# Patient Record
Sex: Female | Born: 1980 | Race: White | Hispanic: No | Marital: Married | Smoking: Current every day smoker
Health system: Southern US, Community
[De-identification: ages and names within clinical notes are randomized; demographics above are authoritative.]

## PROBLEM LIST (undated history)

## (undated) DIAGNOSIS — L659 Nonscarring hair loss, unspecified: Secondary | ICD-10-CM

## (undated) DIAGNOSIS — F319 Bipolar disorder, unspecified: Secondary | ICD-10-CM

## (undated) DIAGNOSIS — N926 Irregular menstruation, unspecified: Secondary | ICD-10-CM

## (undated) DIAGNOSIS — E669 Obesity, unspecified: Secondary | ICD-10-CM

## (undated) DIAGNOSIS — F329 Major depressive disorder, single episode, unspecified: Secondary | ICD-10-CM

## (undated) DIAGNOSIS — F32A Depression, unspecified: Secondary | ICD-10-CM

## (undated) DIAGNOSIS — Z87442 Personal history of urinary calculi: Secondary | ICD-10-CM

## (undated) DIAGNOSIS — K219 Gastro-esophageal reflux disease without esophagitis: Secondary | ICD-10-CM

## (undated) DIAGNOSIS — R002 Palpitations: Secondary | ICD-10-CM

## (undated) DIAGNOSIS — G43909 Migraine, unspecified, not intractable, without status migrainosus: Secondary | ICD-10-CM

## (undated) HISTORY — DX: Palpitations: R00.2

## (undated) HISTORY — DX: Bipolar disorder, unspecified: F31.9

## (undated) HISTORY — DX: Obesity, unspecified: E66.9

## (undated) HISTORY — DX: Migraine, unspecified, not intractable, without status migrainosus: G43.909

## (undated) HISTORY — DX: Irregular menstruation, unspecified: N92.6

## (undated) HISTORY — DX: Nonscarring hair loss, unspecified: L65.9

## (undated) HISTORY — DX: Major depressive disorder, single episode, unspecified: F32.9

## (undated) HISTORY — DX: Depression, unspecified: F32.A

## (undated) HISTORY — PX: TUBAL LIGATION: SHX77

## (undated) HISTORY — DX: Gastro-esophageal reflux disease without esophagitis: K21.9

---

## 2004-12-16 ENCOUNTER — Ambulatory Visit: Payer: Self-pay | Admitting: Family Medicine

## 2005-07-27 ENCOUNTER — Observation Stay: Payer: Self-pay

## 2005-07-28 ENCOUNTER — Ambulatory Visit: Payer: Self-pay | Admitting: Obstetrics & Gynecology

## 2005-09-08 ENCOUNTER — Inpatient Hospital Stay: Payer: Self-pay

## 2005-09-19 IMAGING — US US PELV - US TRANSVAGINAL
2 series · 13 of 25 positions shown · non-contrast
Comparison: none

REASON FOR EXAM: Excessive menses, severe cramping
COMMENTS:

[Series 1: us pelv - us transvaginal · 0.13mm/px · 8 of 36 slices shown (1 of 2)]
[im 1/36]
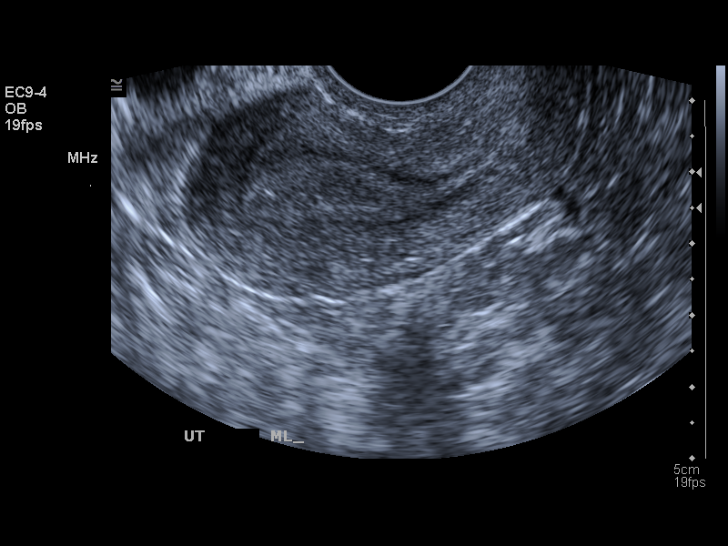
[im 6/36]
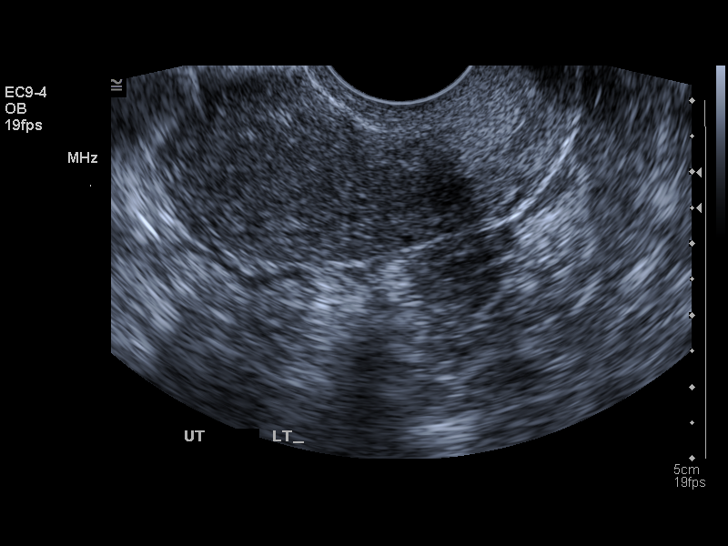
[im 11/36]
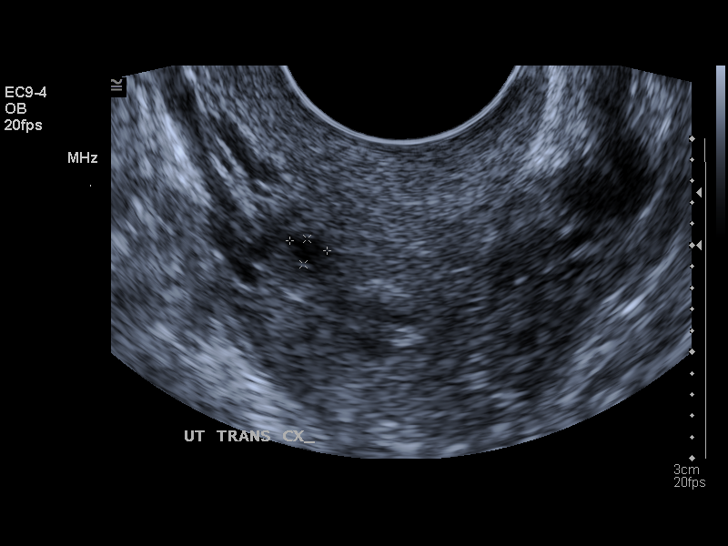
[im 16/36]
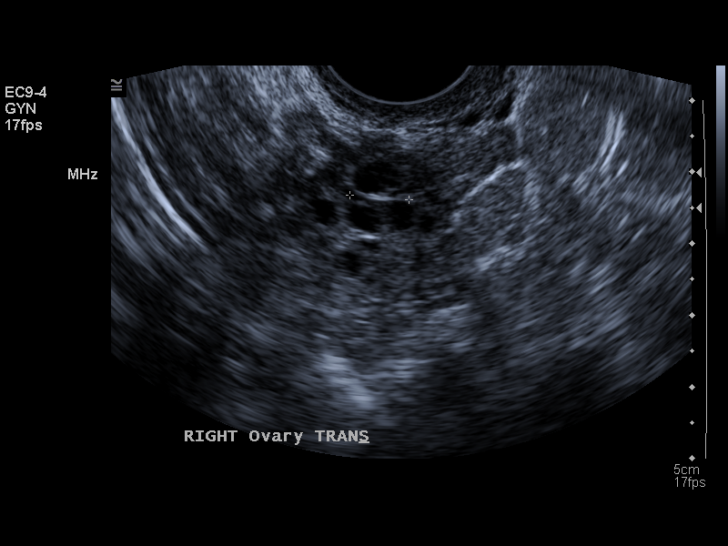
[im 21/36]
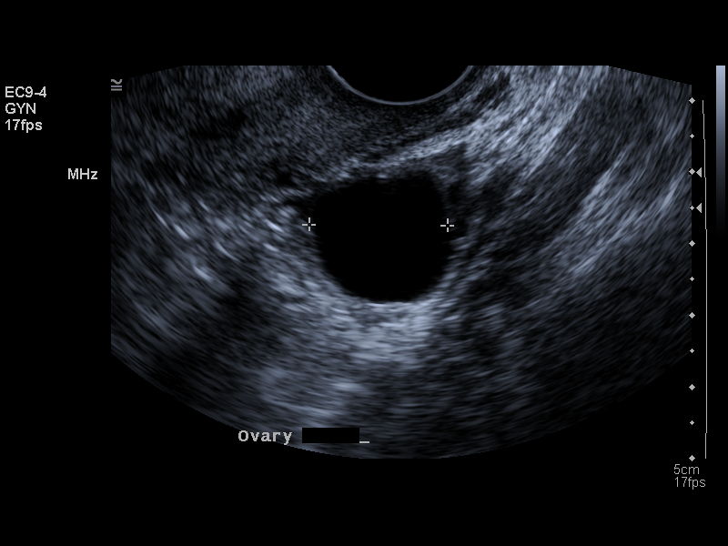
[im 26/36]
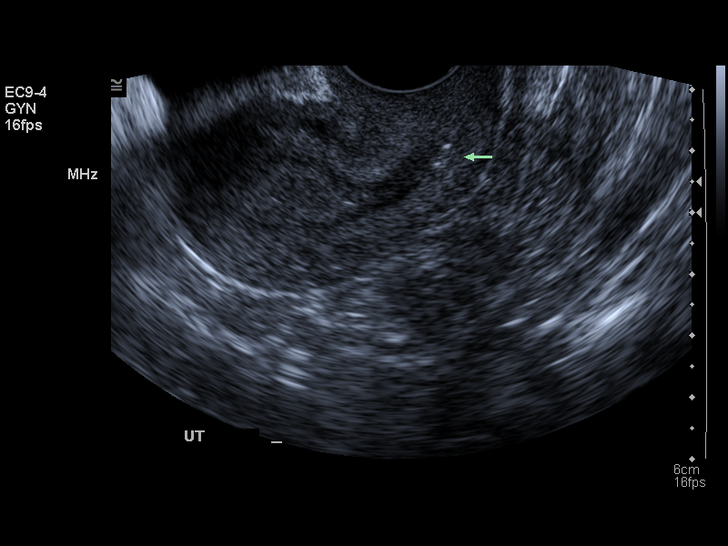
[im 31/36]
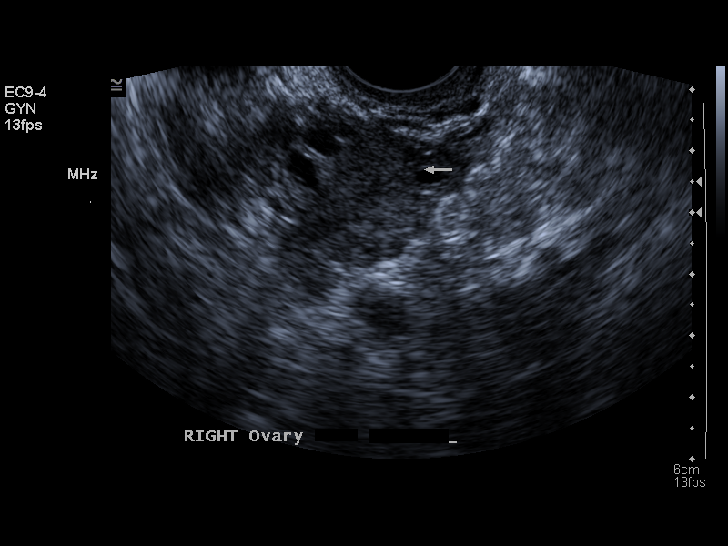
[im 36/36]
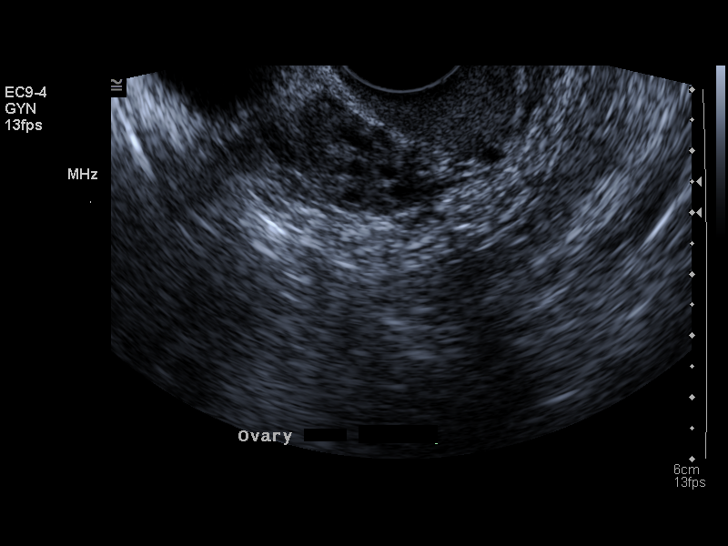

[Series 1: us pelv - us transvaginal · 0.33mm/px · 5 of 23 slices shown (2 of 2)]
[im 3/23]
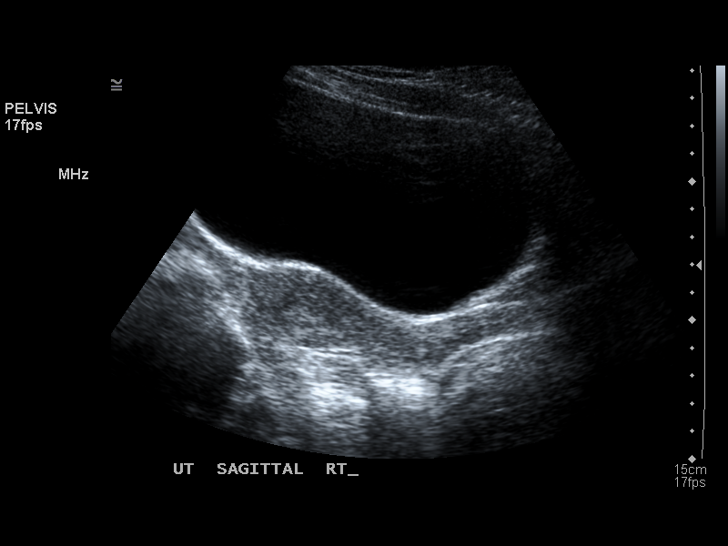
[im 8/23]
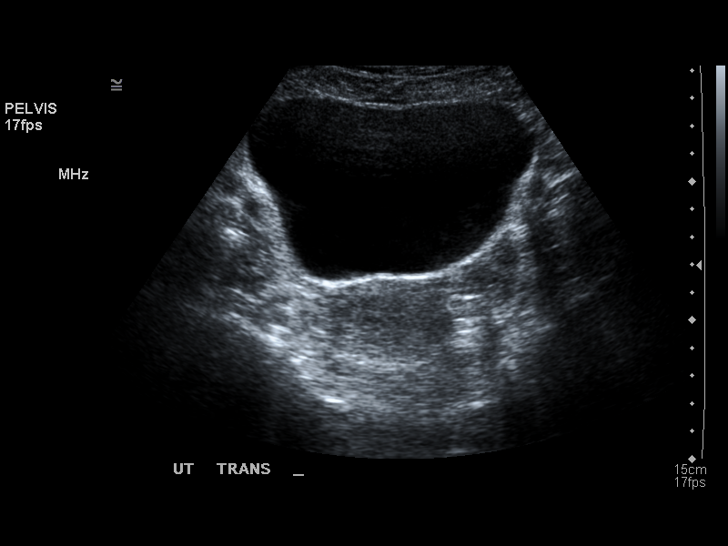
[im 13/23]
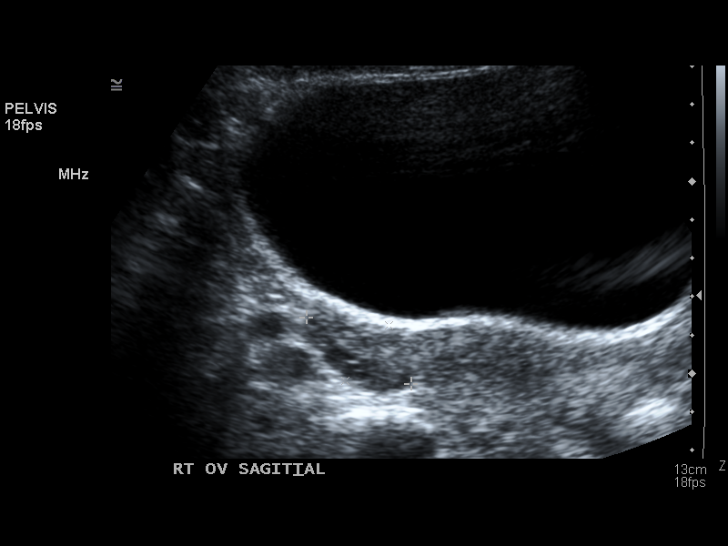
[im 18/23]
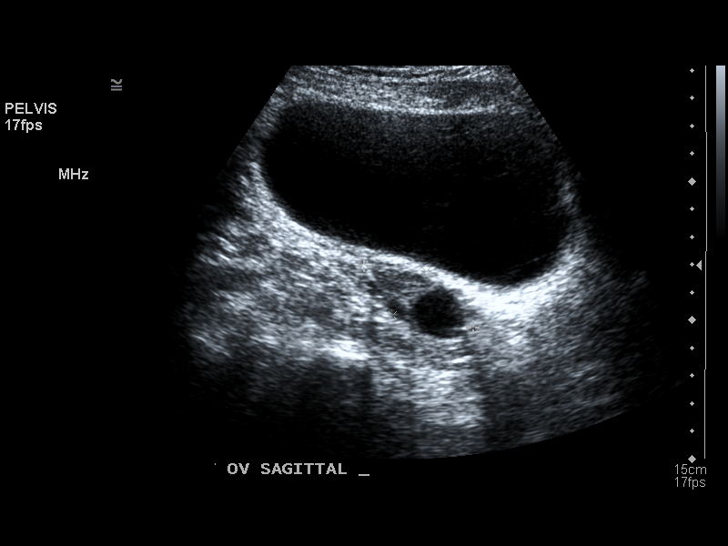
[im 23/23]
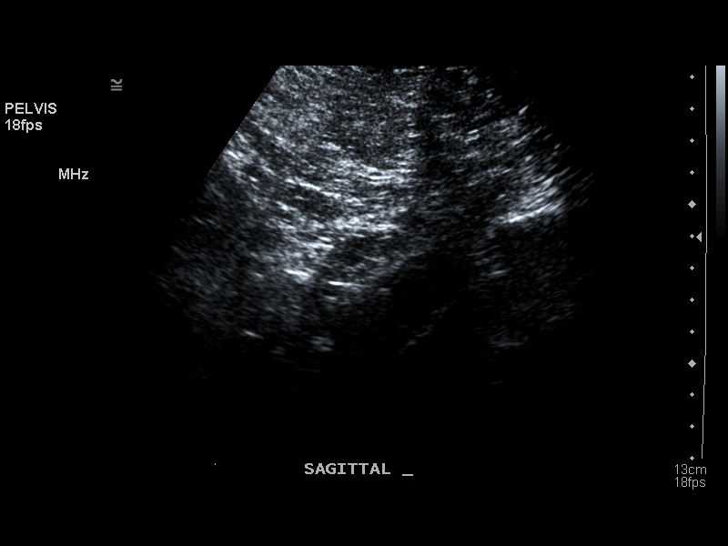

[13 of 25 positions shown; findings below may reference images not displayed]

PROCEDURE:     US  - US PELVIS MASS EXAM  - [DATE]  [DATE] [DATE]  [DATE]

RESULT:     The patient has menorrhagia and severe cramping.  The patient
reports the majority of her symptoms are on the RIGHT.

There is a simple-appearing cystic structure involving the LEFT ovary
measuring approximately 3.6 x 2.1 x 1.9 cm.  There are other smaller cystic
structures associated with the LEFT ovary.  The overall maximal dimension of
the LEFT ovary including both solid and cystic components is approximately
2.1 cm.  The RIGHT ovary exhibits multiple tiny cystic structures or
developing follicles.  The overall maximal dimension of the RIGHT ovary is
approximately 3.1 cm.  The uterus exhibits normal echo texture but the
endometrial stripe does appear thickened.  The uterus measures 7.5 x 3.4 x
4.8 cm and the endometrial stripe is 8.7 mm.  No free fluid is seen in the
cul-de-sac.
IMPRESSION: There is a dominant simple-appearing structure in the LEFT adnexal region
which measures approximately 2.1 cm in greatest dimension.

The RIGHT ovary exhibits developing follicles but no abnormal masses.

The endometrial stripe is thickened at approximately 8.7 mm.  The uterus is
otherwise normal in appearance.

## 2009-02-27 ENCOUNTER — Other Ambulatory Visit: Payer: Self-pay | Admitting: Specialist

## 2010-11-13 ENCOUNTER — Inpatient Hospital Stay: Payer: Self-pay

## 2011-12-02 LAB — HM PAP SMEAR: HM Pap smear: NORMAL

## 2011-12-02 LAB — LIPID PANEL
CHOLESTEROL: 187 mg/dL (ref 0–200)
HDL: 44 mg/dL (ref 35–70)
LDL CALC: 127 mg/dL
Triglycerides: 79 mg/dL (ref 40–160)

## 2013-12-20 ENCOUNTER — Ambulatory Visit: Payer: Self-pay | Admitting: Obstetrics & Gynecology

## 2013-12-20 LAB — CBC
HCT: 39.3 % (ref 35.0–47.0)
HGB: 13.7 g/dL (ref 12.0–16.0)
MCH: 31.2 pg (ref 26.0–34.0)
MCHC: 34.9 g/dL (ref 32.0–36.0)
MCV: 89 fL (ref 80–100)
PLATELETS: 204 10*3/uL (ref 150–440)
RBC: 4.4 10*6/uL (ref 3.80–5.20)
RDW: 13.2 % (ref 11.5–14.5)
WBC: 6.1 10*3/uL (ref 3.6–11.0)

## 2013-12-23 LAB — HM PAP SMEAR: HM PAP: NORMAL

## 2013-12-27 ENCOUNTER — Ambulatory Visit: Payer: Self-pay | Admitting: Obstetrics & Gynecology

## 2013-12-27 HISTORY — PX: DILATION AND CURETTAGE OF UTERUS: SHX78

## 2013-12-29 LAB — PATHOLOGY REPORT

## 2014-01-25 ENCOUNTER — Emergency Department: Payer: Self-pay | Admitting: Emergency Medicine

## 2014-01-25 LAB — CBC
HCT: 38 % (ref 35.0–47.0)
HGB: 12.8 g/dL (ref 12.0–16.0)
MCH: 30.2 pg (ref 26.0–34.0)
MCHC: 33.8 g/dL (ref 32.0–36.0)
MCV: 89 fL (ref 80–100)
Platelet: 232 10*3/uL (ref 150–440)
RBC: 4.26 10*6/uL (ref 3.80–5.20)
RDW: 13 % (ref 11.5–14.5)
WBC: 5.1 10*3/uL (ref 3.6–11.0)

## 2015-02-01 NOTE — Op Note (Signed)
PATIENT NAME:  Kara [name], Kara [name] MR#:  [id number] DATE OF BIRTH:  02/08/1981  DATE OF PROCEDURE:  12/27/2013  PREOPERATIVE DIAGNOSIS: Abnormal uterine bleeding and endometrial abnormality.   POSTOPERATIVE DIAGNOSIS: Abnormal uterine bleeding and endometrial abnormality.   PROCEDURE: Hysteroscopy dilatation and curettage procedure .  SURGEON: Dierdre Searles. Paul Harris, MD  ANESTHESIA: General.   BLOOD LOSS: Minimal.  COMPLICATIONS: None.  FINDINGS: Proliferative endometrium with possible polypoid tissue.   DISPOSITION: To recovery room stable.  TECHNIQUE: The patient was prepped and draped in usual sterile fashion after adequate anesthesia is obtained in a dorsal lithotomy position. The bladder was drained with a Robinson catheter. The anterior lip of the cervix was grasped with a sharp toothed tenaculum and the cervix is easily dilatable, as well as sounded to 6 cm. A 30 degree hysteroscope with lactated ringer distention of the intrauterine cavity is performed with the above mentioned findings visualized. Using the MyoSure device, careful resection of all proliferative tissue and polypoid tissue is performed with an excellent contour and lining visualized at the conclusion of this part of the procedure. There is minimal discrepancy of fluid  during the portion of the case. The hysteroscope is removed and the tenaculum is also removed from the cervix with excellent hemostasis noted. The patient goes to recovery room in stable condition. Endocervical curettage was performed as well and this plus the endometrial curettage was sent to pathology for further review.    ____________________________ R. Annamarie MajorPaul Harris, MD rph:sg D: 12/27/2013 12:00:00 ET T: 12/27/2013 12:15:07 ET JOB#: [id number]  cc: Dierdre Searles. Paul Harris, MD, <Dictator> Nadara MustardOBERT P HARRIS MD ELECTRONICALLY SIGNED 12/28/2013 2:46

## 2015-04-08 ENCOUNTER — Telehealth: Payer: Self-pay | Admitting: Family Medicine

## 2015-04-08 NOTE — Telephone Encounter (Signed)
PT IS ASKING TO GET APPT TO GET BACK ON HER BIPOLAR MEDICATION AND I DO NOT SEE ANYTHING FOR SEVERAL WEEKS. CAN YOU ADVISE ME OF A TIME THAT WE COULD MAYBE FIT HER IN THE SCHEDULE.  PT PHONE NUMBER IS [phone].THANKS

## 2015-04-09 NOTE — Telephone Encounter (Signed)
It can be the last appointment of the day as long as we have a nurse to stay late with me

## 2015-04-09 NOTE — Telephone Encounter (Signed)
Pt was called to see if she could come in 04/10/15 or 04/11/15 and she stated that needed an appt @ 0330 or later because her job won't let her off. Please advise as to where to sched her.

## 2015-04-10 ENCOUNTER — Encounter: Payer: Self-pay | Admitting: Family Medicine

## 2015-04-10 ENCOUNTER — Ambulatory Visit (INDEPENDENT_AMBULATORY_CARE_PROVIDER_SITE_OTHER): Payer: Self-pay | Admitting: Family Medicine

## 2015-04-10 VITALS — BP 128/68 | HR 103 | Temp 98.0°F | Resp 18 | Ht 64.0 in | Wt 175.0 lb

## 2015-04-10 DIAGNOSIS — E669 Obesity, unspecified: Secondary | ICD-10-CM | POA: Insufficient documentation

## 2015-04-10 DIAGNOSIS — F319 Bipolar disorder, unspecified: Secondary | ICD-10-CM | POA: Insufficient documentation

## 2015-04-10 DIAGNOSIS — G43009 Migraine without aura, not intractable, without status migrainosus: Secondary | ICD-10-CM | POA: Insufficient documentation

## 2015-04-10 DIAGNOSIS — F3131 Bipolar disorder, current episode depressed, mild: Secondary | ICD-10-CM

## 2015-04-10 DIAGNOSIS — K219 Gastro-esophageal reflux disease without esophagitis: Secondary | ICD-10-CM

## 2015-04-10 DIAGNOSIS — G43019 Migraine without aura, intractable, without status migrainosus: Secondary | ICD-10-CM

## 2015-04-10 DIAGNOSIS — B001 Herpesviral vesicular dermatitis: Secondary | ICD-10-CM | POA: Insufficient documentation

## 2015-04-10 DIAGNOSIS — E66811 Obesity, class 1: Secondary | ICD-10-CM | POA: Insufficient documentation

## 2015-04-10 MED ORDER — DIVALPROEX SODIUM 250 MG PO DR TAB: 250.0000 mg | DELAYED_RELEASE_TABLET | Freq: Every evening | ORAL | Status: DC

## 2015-04-10 MED ORDER — CITALOPRAM HYDROBROMIDE 20 MG PO TABS: 20.0000 mg | ORAL_TABLET | Freq: Every day | ORAL | Status: DC

## 2015-04-10 MED ORDER — SUMATRIPTAN SUCCINATE 100 MG PO TABS: 100.0000 mg | ORAL_TABLET | ORAL | Status: DC | PRN

## 2015-04-10 NOTE — Telephone Encounter (Signed)
Pt coming in today at 4 per your request

## 2015-04-10 NOTE — Progress Notes (Signed)
Name: Kara [name]   MRN: [id number]    DOB: 1981/05/27   Date:04/10/2015       Progress Note  Subjective  Chief Complaint  Chief Complaint  Patient presents with  . Medication Refill    1 year F/U  . Anxiety    Panic Attacks worsten with no medications-couple of times weekly-stress working 2 jobs  . Manic Behavior    Patient states she stays angry without taking her medications    HPI  Migraine headaches: she does not have aura, pain is behind her eyes, described as sharp, may be associated with nausea but no vomiting, also has phonophobia and photophobia. Episodes have been more frequent now, almost daily headaches, taking ibuprofen to control symptoms.   Bipolar Disorder: she has been out of all her medication since Fall on 2015 when she lost her job. She states initially she was feeling well, but over past six months symptoms have been progressively worse, but much worse over the past month and she decided to come in to resume medication.  No recent episodes of mania, but is feeling very depression. No energy to get up in the morning. Able to perform ADL, even though she does not want to. She has anhedonia. Having difficulty communicating with the father of her children. She lives with her parents .   GERD: off PPI since the Fall, episodes are seldom and is taking Tums a couple times month.  Patient Active Problem List   Diagnosis Date Noted  . Affective bipolar disorder 04/10/2015  . Gastro-esophageal reflux disease without esophagitis 04/10/2015  . Migraine without aura and responsive to treatment 04/10/2015  . Obesity (BMI 30-39.9) 04/10/2015  . Cold sore 04/10/2015    Past Surgical History  Procedure Laterality Date  . Dilation and curettage of uterus  12/27/2013  . Tubal ligation      Family History  Problem Relation Age of Onset  . Metabolic syndrome Mother   . COPD Father     History   Social History  . Marital Status: Married    Spouse Name: N/A  .  Number of Children: N/A  . Years of Education: N/A   Occupational History  . Not on file.   Social History Main Topics  . Smoking status: Former Smoker -- 0.50 packs/day for 2 years    Types: Cigarettes    Start date: 01/10/2003    Quit date: 01/09/2005  . Smokeless tobacco: Never Used  . Alcohol Use: 0.0 oz/week    0 Standard drinks or equivalent per week     Comment: occasionally  . Drug Use: No  . Sexual Activity: No   Other Topics Concern  . Not on file   Social History Narrative     Current outpatient prescriptions:  .  citalopram (CELEXA) 20 MG tablet, Take 1 tablet (20 mg total) by mouth daily., Disp: 30 tablet, Rfl: 2 .  divalproex (DEPAKOTE) 250 MG DR tablet, Take 1 tablet (250 mg total) by mouth every evening., Disp: 30 tablet, Rfl: 2 .  SUMAtriptan (IMITREX) 100 MG tablet, Take 1 tablet (100 mg total) by mouth as needed (may take a second pill in 2 hours, no more than 2 in 24 hours)., Disp: 10 tablet, Rfl: 2  No Known Allergies   ROS  Constitutional: Negative for fever, mild weight loss.  Respiratory: Negative for cough and shortness of breath.   Cardiovascular: Negative for chest pain or palpitations.  Gastrointestinal: Negative for abdominal pain, no bowel changes.  Musculoskeletal: Negative for gait problem or joint swelling.  Skin: Negative for rash.  Neurological: Negative for dizziness but positive for headache.  No other specific complaints in a complete review of systems (except as listed in HPI above).   Objective  Filed Vitals:   04/10/15 1602  BP: 128/68  Pulse: 103  Temp: 98 F (36.7 C)  TempSrc: Oral  Resp: 18  Height:  (1.626 m)  Weight: 175 lb (79.379 kg)  SpO2: 98%    Body mass index is 30.02 kg/(m^2).  Physical Exam  Constitutional: Patient appears well-developed and well-nourished. No distress.  Eyes:  No scleral icterus.  PERL Neck: Normal range of motion. Neck supple. Cardiovascular: Normal rate, regular rhythm and  normal heart sounds.  No murmur heard. No BLE edema. Pulmonary/Chest: Effort normal and breath sounds normal. No respiratory distress. Abdominal: Soft.  There is no tenderness. Psychiatric: Patient has a normal mood and affect. behavior is normal. Judgment and thought content normal.  PHQ2/9: Depression screen PHQ 2/9 04/10/2015  Decreased Interest 3  Down, Depressed, Hopeless 3  PHQ - 2 Score 6  Altered sleeping 3  Tired, decreased energy 3  Change in appetite 3  Feeling bad or failure about yourself  3  Trouble concentrating 0  Moving slowly or fidgety/restless 0  Suicidal thoughts 0  PHQ-9 Score 18  Difficult doing work/chores Not difficult at all   She wants to resume medication  Fall Risk: Fall Risk  04/10/2015  Falls in the past year? No     Assessment & Plan  1. Bipolar disorder, current episode depressed, severe, without psychotic features She is willing to resume medication  - divalproex (DEPAKOTE) 250 MG DR tablet; Take 1 tablet (250 mg total) by mouth every evening.  Dispense: 30 tablet; Refill: 2 - citalopram (CELEXA) 20 MG tablet; Take 1 tablet (20 mg total) by mouth daily.  Dispense: 30 tablet; Refill: 2  2. Migraine without aura, intractable Resume medications, avoid nsaid's on a regular basis, try drinking more water, sleeping more and resume Depakote - SUMAtriptan (IMITREX) 100 MG tablet; Take 1 tablet (100 mg total) by mouth as needed (may take a second pill in 2 hours, no more than 2 in 24 hours).  Dispense: 10 tablet; Refill: 2 - divalproex (DEPAKOTE) 250 MG DR tablet; Take 1 tablet (250 mg total) by mouth every evening.  Dispense: 30 tablet; Refill: 2  3. Gastro-esophageal reflux disease without esophagitis Off medication, symptoms are under control

## 2015-04-10 NOTE — Patient Instructions (Signed)
  GET HELP  Contact a suicide hotline, crisis center, or local suicide prevention center for help right away. Local centers may include a hospital, clinic, community service organization, social service provider, or health department.  Call your local emergency services (911 in the [place]).  Call a suicide hotline:  1-800-273-TALK (956-672-81551-(323)010-5951) in the [place].  1-800-SUICIDE 281-682-7871(1-450-558-3310) in the [place].  [phone] in the [place] for Spanish-speaking counselors.  4-696-295-2WUX1-800-799-4TTY 863-465-7821(1-443-449-0257) in the [place] for TTY users.  Visit the following websites for information and help:  National Suicide Prevention Lifeline: www.suicidepreventionlifeline.org  Hopeline: www.hopeline.com  [employer] for Suicide Prevention: https://www.ayers.com/www.afsp.org  For lesbian, gay, bisexual, transgender, or questioning youth, contact [employer]:  3-664-4-I-HKVQQV1-866-4-U-TREVOR 608-058-3918(1-(986)075-9117) in the [place].  www.thetrevorproject.org  In [place], treatment resources are listed in each province with listings available under [employer] or similar titles. Another source for Crisis Centres by [place] is located at http://www.suicideprevention.ca/in-crisis-now/find-a-crisis-centre-now/crisis-centres Document Released: 04/03/2003 Document Revised: 12/20/2011 Document Reviewed: 06/04/2008 [facility] Patient Information 2015 [place]. This information is not intended to replace advice given to you by your health care provider. Make sure you discuss any questions you have with your health care provider.

## 2016-08-30 ENCOUNTER — Encounter: Payer: Self-pay | Admitting: Physician Assistant

## 2016-08-30 ENCOUNTER — Ambulatory Visit (INDEPENDENT_AMBULATORY_CARE_PROVIDER_SITE_OTHER): Payer: Self-pay | Admitting: Physician Assistant

## 2016-08-30 VITALS — BP 118/78 | HR 93 | Temp 98.2°F | Resp 14 | Ht 64.0 in | Wt 186.0 lb

## 2016-08-30 DIAGNOSIS — F3130 Bipolar disorder, current episode depressed, mild or moderate severity, unspecified: Secondary | ICD-10-CM

## 2016-08-30 DIAGNOSIS — F319 Bipolar disorder, unspecified: Secondary | ICD-10-CM | POA: Insufficient documentation

## 2016-08-30 MED ORDER — QUETIAPINE FUMARATE ER 50 MG PO TB24: ORAL_TABLET | ORAL | 0 refills | Status: DC

## 2016-08-30 NOTE — Progress Notes (Signed)
Patient presents to clinic today to establish care.  Patient with long-standing history of bipolar disorder, previously treated with a combination of Depakote and Citalopram. Patient has been out of medications for over a year due to cost and losing her insurance. Has noted worsening depression with anhedonia, irritability and depressed mood. Denies panic attack. Denies manic attacks or other symptoms of mania. Denies SI/HI. Is seeing a counselor currently. Cannot get in for medication management with psychiatry until the first of the year.  Health Maintenance: Immunizations -- Declines flu shot. PAP --  Endorses up-to-date. Denies history of abnormal PAP smear.  Past Medical History:  Diagnosis Date  . Bipolar disorder (HCC)   . Depression   . GERD (gastroesophageal reflux disease)   . Hair loss   . Irregular menstrual cycle   . Migraines   . Obesity   . Palpitation     Past Surgical History:  Procedure Laterality Date  . DILATION AND CURETTAGE OF UTERUS  12/27/2013  . TUBAL LIGATION      No current outpatient prescriptions on file prior to visit.   No current facility-administered medications on file prior to visit.     No Known Allergies  Family History  Problem Relation Age of Onset  . Metabolic syndrome Mother   . Cancer Mother     Ovarian and Cervical  . COPD Father     Social History   Social History  . Marital status: Married    Spouse name: N/A  . Number of children: N/A  . Years of education: N/A   Occupational History  . Not on file.   Social History Main Topics  . Smoking status: Current Every Day Smoker    Packs/day: 1.00    Years: 2.00    Types: Cigarettes  . Smokeless tobacco: Never Used  . Alcohol use 0.0 oz/week     Comment: rare  . Drug use: No  . Sexual activity: Yes    Partners: Female   Other Topics Concern  . Not on file   Social History Narrative  . No narrative on file   Review of Systems  Constitutional: Negative  for chills and fever.  Respiratory: Negative for cough and shortness of breath.   Cardiovascular: Negative for chest pain.  Neurological: Negative for dizziness and loss of consciousness.  Psychiatric/Behavioral: Positive for depression. Negative for hallucinations, memory loss, substance abuse and suicidal ideas. The patient is not nervous/anxious and does not have insomnia.    BP 118/78   Pulse 93   Temp 98.2 F (36.8 C) (Oral)   Resp 14   Ht 5\' 4"  (1.626 m)   Wt 186 lb (84.4 kg)   SpO2 98%   BMI 31.93 kg/m   Physical Exam  Constitutional: She is oriented to person, place, and time and well-developed, well-nourished, and in no distress.  HENT:  Head: Normocephalic and atraumatic.  Right Ear: External ear normal.  Left Ear: External ear normal.  Nose: Nose normal.  Mouth/Throat: Oropharynx is clear and moist. No oropharyngeal exudate.  TM within normal limits bilaterally.  Eyes: Conjunctivae are normal.  Neck: Neck supple.  Cardiovascular: Normal rate, regular rhythm, normal heart sounds and intact distal pulses.   Pulmonary/Chest: Effort normal and breath sounds normal. No respiratory distress. She has no wheezes. She has no rales. She exhibits no tenderness.  Abdominal: Soft. Bowel sounds are normal.  Neurological: She is alert and oriented to person, place, and time.  Skin: Skin is warm and  dry. No rash noted.  Psychiatric: Affect normal.  Vitals reviewed.  Assessment/Plan: Bipolar depression (HCC) No manic episodes. No alarm signs/symptoms. Discussed treatment options. Discussed potential costs. Decided to attempt trial of Seroquel XR. Patient to fill out assistance paperwork to help with cost until she has insurance. Will start with 50 mg QHS, increase to 100 mg after 2 days. She is to call as discussed to let us know how she is doing for further titration of medication. Will attempt to titrate to 300 mg per Up-to-date guidelines. FU scheduled for 2 weeks. ER precautions  reviewed with patient and family member present for visit.    Piedad ClimesMartin, Ellard Nan Cody, PA-C

## 2016-08-30 NOTE — Assessment & Plan Note (Signed)
No manic episodes. No alarm signs/symptoms. Discussed treatment options. Discussed potential costs. Decided to attempt trial of Seroquel XR. Patient to fill out assistance paperwork to help with cost until she has insurance. Will start with 50 mg QHS, increase to 100 mg after 2 days. She is to call as discussed to let us know how she is doing for further titration of medication. Will attempt to titrate to 300 mg per Up-to-date guidelines. FU scheduled for 2 weeks. ER precautions reviewed with patient and family member present for visit.

## 2016-08-30 NOTE — Patient Instructions (Signed)
Please start the Seroquel each evening as directed. Will will start low and work up to a higher, more therapeutic dose of medication.  Call me in 1 week to let me know how you are tolerating medication.  If you note any side effects, please call the office. If you note any worsening symptoms or thoughts of harming yourself or others, please stop medication and call 911 or [facility].  Follow-up in 2-3 weeks.

## 2016-08-30 NOTE — Progress Notes (Signed)
Pre visit review using our clinic review tool, if applicable. No additional management support is needed unless otherwise documented below in the visit note. 

## 2016-09-20 ENCOUNTER — Ambulatory Visit (INDEPENDENT_AMBULATORY_CARE_PROVIDER_SITE_OTHER): Payer: Self-pay | Admitting: Physician Assistant

## 2016-09-20 ENCOUNTER — Encounter: Payer: Self-pay | Admitting: Physician Assistant

## 2016-09-20 VITALS — BP 102/70 | HR 111 | Temp 98.0°F | Resp 14 | Ht 64.0 in | Wt 189.0 lb

## 2016-09-20 DIAGNOSIS — F3130 Bipolar disorder, current episode depressed, mild or moderate severity, unspecified: Secondary | ICD-10-CM

## 2016-09-20 DIAGNOSIS — R000 Tachycardia, unspecified: Secondary | ICD-10-CM

## 2016-09-20 DIAGNOSIS — F319 Bipolar disorder, unspecified: Secondary | ICD-10-CM

## 2016-09-20 LAB — TSH: TSH: 0.83 m[IU]/L

## 2016-09-20 MED ORDER — ARIPIPRAZOLE 5 MG PO TABS: 5.0000 mg | ORAL_TABLET | Freq: Every day | ORAL | 0 refills | Status: DC

## 2016-09-20 NOTE — Progress Notes (Signed)
Pre visit review using our clinic review tool, if applicable. No additional management support is needed unless otherwise documented below in the visit note. 

## 2016-09-20 NOTE — Progress Notes (Signed)
   Patient presents to clinic today for follow-up of bipolar disorder. Patient endorses taking Seroquel as directed without improvement in mood. Notes increased fluctuating mood without manic episode. Notes depressed mood without SI/HI. Would like to discuss alternative medication as she notes drowsiness with the Seroquel.   Past Medical History:  Diagnosis Date  . Bipolar disorder (HCC)   . Depression   . GERD (gastroesophageal reflux disease)   . Hair loss   . Irregular menstrual cycle   . Migraines   . Obesity   . Palpitation     No current outpatient prescriptions on file prior to visit.   No current facility-administered medications on file prior to visit.     No Known Allergies  Family History  Problem Relation Age of Onset  . Metabolic syndrome Mother   . Cancer Mother     Ovarian and Cervical  . COPD Father     Social History   Social History  . Marital status: Married    Spouse name: N/A  . Number of children: N/A  . Years of education: N/A   Social History Main Topics  . Smoking status: Current Every Day Smoker    Packs/day: 1.00    Years: 2.00    Types: Cigarettes  . Smokeless tobacco: Never Used  . Alcohol use 0.0 oz/week     Comment: rare  . Drug use: No  . Sexual activity: Yes    Partners: Female   Other Topics Concern  . None   Social History Narrative  . None   Review of Systems - See HPI.  All other ROS are negative.  BP 102/70   Pulse (!) 111   Temp 98 F (36.7 C) (Oral)   Resp 14   Ht 5\' 4"  (1.626 m)   Wt 189 lb (85.7 kg)   SpO2 98%   BMI 32.44 kg/m   Physical Exam  Constitutional: She is oriented to person, place, and time and well-developed, well-nourished, and in no distress.  HENT:  Head: Normocephalic and atraumatic.  Eyes: Conjunctivae are normal.  Neck: Neck supple.  Cardiovascular: Regular rhythm, normal heart sounds and intact distal pulses.  Tachycardia present.   Pulmonary/Chest: Effort normal and breath sounds  normal. No respiratory distress. She has no wheezes. She has no rales. She exhibits no tenderness.  Neurological: She is alert and oriented to person, place, and time.  Skin: Skin is warm and dry. No rash noted.  Psychiatric: Affect normal.  Vitals reviewed.   Recent Results (from the past 2160 hour(s))  TSH     Status: None   Collection Time: 09/20/16  3:41 PM  Result Value Ref Range   TSH 0.83 mIU/L    Comment:   Reference Range   > or = 20 Years  0.40-4.50   Pregnancy Range First trimester  0.26-2.66 Second trimester 0.55-2.73 Third trimester  0.43-2.91       Assessment/Plan: Bipolar depression (HCC) Will witch Seroquel for Abilify 5 mg. Take daily as directed. FU discussed. Discussed alarm signs/symptoms with patient today that would prompt ER assessment. Patient voices understanding.   Increased heart rate Noted on examination today.Mild increase. Asymptomatic. Suspect related to anxiety. Will check TSH today. Increase hydration. Will monitor.     Piedad ClimesMartin, Ceaser Ebeling Cody, PA-C

## 2016-09-20 NOTE — Patient Instructions (Signed)
Please stop the Seroquel and start the Abilify once daily as directed. Follow-up with us in 3 weeks. If you note any worsening mood or side effect of medication, please call or come see us.  If you have any thoughts of harming yourself or others, please call 911 or go to the nearest ER.

## 2016-09-21 DIAGNOSIS — R000 Tachycardia, unspecified: Secondary | ICD-10-CM | POA: Insufficient documentation

## 2016-09-21 NOTE — Assessment & Plan Note (Signed)
Noted on examination today.Mild increase. Asymptomatic. Suspect related to anxiety. Will check TSH today. Increase hydration. Will monitor.

## 2016-09-21 NOTE — Assessment & Plan Note (Signed)
Will witch Seroquel for Abilify 5 mg. Take daily as directed. FU discussed. Discussed alarm signs/symptoms with patient today that would prompt ER assessment. Patient voices understanding.

## 2016-10-18 ENCOUNTER — Ambulatory Visit (INDEPENDENT_AMBULATORY_CARE_PROVIDER_SITE_OTHER): Payer: BLUE CROSS/BLUE SHIELD | Admitting: Physician Assistant

## 2016-10-18 ENCOUNTER — Encounter: Payer: Self-pay | Admitting: Physician Assistant

## 2016-10-18 VITALS — BP 118/78 | HR 99 | Temp 98.3°F | Resp 14 | Ht 64.0 in | Wt 187.0 lb

## 2016-10-18 DIAGNOSIS — R140 Abdominal distension (gaseous): Secondary | ICD-10-CM | POA: Insufficient documentation

## 2016-10-18 DIAGNOSIS — F319 Bipolar disorder, unspecified: Secondary | ICD-10-CM

## 2016-10-18 DIAGNOSIS — F3130 Bipolar disorder, current episode depressed, mild or moderate severity, unspecified: Secondary | ICD-10-CM

## 2016-10-18 LAB — CBC
HCT: 39.7 % (ref 36.0–46.0)
Hemoglobin: 13.4 g/dL (ref 12.0–15.0)
MCHC: 33.8 g/dL (ref 30.0–36.0)
MCV: 88.9 fl (ref 78.0–100.0)
PLATELETS: 274 10*3/uL (ref 150.0–400.0)
RBC: 4.46 Mil/uL (ref 3.87–5.11)
RDW: 13.1 % (ref 11.5–15.5)
WBC: 7.1 10*3/uL (ref 4.0–10.5)

## 2016-10-18 LAB — COMPREHENSIVE METABOLIC PANEL
ALT: 31 U/L (ref 0–35)
AST: 21 U/L (ref 0–37)
Albumin: 4.5 g/dL (ref 3.5–5.2)
Alkaline Phosphatase: 49 U/L (ref 39–117)
BUN: 15 mg/dL (ref 6–23)
CALCIUM: 9.4 mg/dL (ref 8.4–10.5)
CO2: 30 mEq/L (ref 19–32)
Chloride: 103 mEq/L (ref 96–112)
Creatinine, Ser: 0.62 mg/dL (ref 0.40–1.20)
GFR: 117.13 mL/min (ref >60.00)
Glucose, Bld: 83 mg/dL (ref 70–99)
POTASSIUM: 4 meq/L (ref 3.5–5.1)
Sodium: 139 mEq/L (ref 135–145)
Total Bilirubin: 0.3 mg/dL (ref 0.2–1.2)
Total Protein: 7.2 g/dL (ref 6.0–8.3)

## 2016-10-18 NOTE — Assessment & Plan Note (Signed)
Will check labs today. Will start probiotic. Start GERD diet. If not improving and labs normal will refer to GI.

## 2016-10-18 NOTE — Assessment & Plan Note (Signed)
Doing well on Abilify. Will continue current regimen. FU 3 months. Discussed precautions that would prompt earlier follow-up or ER assessment.

## 2016-10-18 NOTE — Progress Notes (Signed)
Patient presents to clinic today for follow-up of Bipolar Disorder. At last visit Seroquel was switched to Abilify 5 mg daily. Endorses taking as directed every evening. Noted initial nausea that has resolved. Denies other side effect. Endorses marked improvement in her mood. Denies depressed mood overall or manic episodes. Endorses family has noted her laughing and smiling more. Denies resolution of easy irritability. Relationship with kids has improved. Endorses feeling much more energetic and active.   Patient notes chronic abdominal bloating intermittent with meals. Has cut out red meats with no major improvement in symptoms. Denies nausea/vomiting. Notes rare heart burn with late night eating. Denies constipation, diarrhea. Denies melena, hematochezia or tenesmus.   Past Medical History:  Diagnosis Date  . Bipolar disorder (HCC)   . Depression   . GERD (gastroesophageal reflux disease)   . Hair loss   . Irregular menstrual cycle   . Migraines   . Obesity   . Palpitation     Current Outpatient Prescriptions on File Prior to Visit  Medication Sig Dispense Refill  . ARIPiprazole (ABILIFY) 5 MG tablet Take 1 tablet (5 mg total) by mouth daily. 30 tablet 0   No current facility-administered medications on file prior to visit.     No Known Allergies  Family History  Problem Relation Age of Onset  . Metabolic syndrome Mother   . Cancer Mother     Ovarian and Cervical  . COPD Father     Social History   Social History  . Marital status: Married    Spouse name: N/A  . Number of children: N/A  . Years of education: N/A   Social History Main Topics  . Smoking status: Current Every Day Smoker    Packs/day: 1.00    Years: 2.00    Types: Cigarettes  . Smokeless tobacco: Never Used  . Alcohol use 0.0 oz/week     Comment: rare  . Drug use: No  . Sexual activity: Yes    Partners: Female   Other Topics Concern  . None   Social History Narrative  . None   Review of  Systems - See HPI.  All other ROS are negative.  BP 118/78   Pulse 99   Temp 98.3 F (36.8 C) (Oral)   Resp 14   Ht 5\' 4"  (1.626 m)   Wt 187 lb (84.8 kg)   SpO2 96%   BMI 32.10 kg/m   Physical Exam  Constitutional: She is oriented to person, place, and time and well-developed, well-nourished, and in no distress.  HENT:  Head: Normocephalic and atraumatic.  Right Ear: Tympanic membrane normal.  Left Ear: Tympanic membrane normal.  Nose: Nose normal.  Mouth/Throat: Uvula is midline, oropharynx is clear and moist and mucous membranes are normal.  Eyes: Conjunctivae are normal.  Neck: Neck supple.  Cardiovascular: Normal rate, regular rhythm, normal heart sounds and intact distal pulses.   Pulmonary/Chest: Effort normal and breath sounds normal. No respiratory distress. She has no wheezes. She has no rales. She exhibits no tenderness.  Abdominal: Soft. Bowel sounds are normal. She exhibits no distension and no mass. There is no tenderness. There is no rebound and no guarding.  Neurological: She is alert and oriented to person, place, and time.  Skin: Skin is warm and dry. No rash noted.  Psychiatric: Affect normal.  Vitals reviewed.  Recent Results (from the past 2160 hour(s))  TSH     Status: None   Collection Time: 09/20/16  3:41 PM  Result  Value Ref Range   TSH 0.83 mIU/L    Comment:   Reference Range   > or = 20 Years  0.40-4.50   Pregnancy Range First trimester  0.26-2.66 Second trimester 0.55-2.73 Third trimester  0.43-2.91      Assessment/Plan: Bipolar depression (HCC) Doing well on Abilify. Will continue current regimen. FU 3 months. Discussed precautions that would prompt earlier follow-up or ER assessment.   Abdominal bloating Will check labs today. Will start probiotic. Start GERD diet. If not improving and labs normal will refer to GI.     Piedad Climes, PA-C

## 2016-10-18 NOTE — Progress Notes (Signed)
Pre visit review using our clinic review tool, if applicable. No additional management support is needed unless otherwise documented below in the visit note. 

## 2016-10-18 NOTE — Patient Instructions (Signed)
Please go to the lab for blood work. I will call you with your results. Continue the Abilify daily as directed. Start a probiotic daily -- recommend Align or digestive advantage but ask the pharmacist for some good generic options (cheaper).   FU 3 month for bipolar disorder as long as symptoms continue to do well.   For abdominal symptoms, if not improving and labs unremarkable, recommend GI assessment.

## 2016-10-19 ENCOUNTER — Encounter: Payer: Self-pay | Admitting: Emergency Medicine

## 2016-11-19 ENCOUNTER — Other Ambulatory Visit: Payer: Self-pay | Admitting: Physician Assistant

## 2016-12-24 ENCOUNTER — Other Ambulatory Visit: Payer: Self-pay | Admitting: Emergency Medicine

## 2016-12-24 MED ORDER — ARIPIPRAZOLE 5 MG PO TABS: 5.0000 mg | ORAL_TABLET | Freq: Every day | ORAL | 0 refills | Status: DC

## 2017-01-17 ENCOUNTER — Encounter: Payer: Self-pay | Admitting: Physician Assistant

## 2017-01-17 ENCOUNTER — Ambulatory Visit (INDEPENDENT_AMBULATORY_CARE_PROVIDER_SITE_OTHER): Payer: BLUE CROSS/BLUE SHIELD | Admitting: Physician Assistant

## 2017-01-17 DIAGNOSIS — F3130 Bipolar disorder, current episode depressed, mild or moderate severity, unspecified: Secondary | ICD-10-CM

## 2017-01-17 DIAGNOSIS — F319 Bipolar disorder, unspecified: Secondary | ICD-10-CM

## 2017-01-17 MED ORDER — ARIPIPRAZOLE 15 MG PO TABS: 15.0000 mg | ORAL_TABLET | Freq: Every day | ORAL | 5 refills | Status: DC

## 2017-01-17 NOTE — Patient Instructions (Signed)
Please start the new dose of the Abilify. Please let me know if symptoms do not continue to improve.   If things are going well, follow-up in 3 months. Return sooner if needed.

## 2017-01-17 NOTE — Progress Notes (Signed)
   Patient presents to clinic today c/o for follow-up of bipolar disorder. Patient is currently on a regimen of Abilify 5 mg daily. Is taking as directed without side effects. Notes moderate improvement in mood. Denies SI/HI. Is sleeping and eating well. No manic episodes but still with depressive episodes.  Past Medical History:  Diagnosis Date  . Bipolar disorder (HCC)   . Depression   . GERD (gastroesophageal reflux disease)   . Hair loss   . Irregular menstrual cycle   . Migraines   . Obesity   . Palpitation     No current outpatient prescriptions on file prior to visit.   No current facility-administered medications on file prior to visit.     No Known Allergies  Family History  Problem Relation Age of Onset  . Metabolic syndrome Mother   . Cancer Mother     Ovarian and Cervical  . COPD Father     Social History   Social History  . Marital status: Married    Spouse name: N/A  . Number of children: N/A  . Years of education: N/A   Social History Main Topics  . Smoking status: Current Every Day Smoker    Packs/day: 0.50    Years: 2.00    Types: Cigarettes  . Smokeless tobacco: Never Used  . Alcohol use 0.0 oz/week     Comment: rare  . Drug use: No  . Sexual activity: Yes    Partners: Female   Other Topics Concern  . None   Social History Narrative  . None    Review of Systems - See HPI.  All other ROS are negative.  BP 110/78   Pulse 90   Temp 98.1 F (36.7 C) (Oral)   Resp 14   Ht  (1.626 m)   Wt 182 lb (82.6 kg)   SpO2 98%   BMI 31.24 kg/m   Physical Exam  Constitutional: She is oriented to person, place, and time and well-developed, well-nourished, and in no distress.  HENT:  Head: Normocephalic and atraumatic.  Eyes: Conjunctivae are normal.  Neck: Neck supple.  Cardiovascular: Normal rate, regular rhythm, normal heart sounds and intact distal pulses.   Pulmonary/Chest: Effort normal and breath sounds normal. No respiratory  distress. She has no wheezes. She has no rales. She exhibits no tenderness.  Neurological: She is alert and oriented to person, place, and time.  Skin: Skin is warm and dry. No rash noted.  Psychiatric: Affect normal.  Vitals reviewed.  Assessment/Plan: Bipolar depression (HCC) Improved with Abilify. Still breakthrough depressed mood. Will increase to 15 mg. Recent CBC/CMP stable. Repeat at FU in 3 months.    Piedad Climes, PA-C

## 2017-01-17 NOTE — Assessment & Plan Note (Signed)
Improved with Abilify. Still breakthrough depressed mood. Will increase to 15 mg. Recent CBC/CMP stable. Repeat at FU in 3 months.

## 2017-01-17 NOTE — Progress Notes (Signed)
Pre visit review using our clinic review tool, if applicable. No additional management support is needed unless otherwise documented below in the visit note. 

## 2017-03-21 ENCOUNTER — Emergency Department: Payer: BLUE CROSS/BLUE SHIELD | Admitting: Anesthesiology

## 2017-03-21 ENCOUNTER — Encounter
Admission: EM | Disposition: A | Discharge status: Home / Self Care | Payer: Self-pay | Source: Home / Self Care | Attending: Emergency Medicine

## 2017-03-21 ENCOUNTER — Emergency Department: Payer: BLUE CROSS/BLUE SHIELD

## 2017-03-21 ENCOUNTER — Encounter: Payer: Self-pay | Admitting: Emergency Medicine

## 2017-03-21 ENCOUNTER — Observation Stay
Admission: EM | Admit: 2017-03-21 | Discharge: 2017-03-22 | Disposition: A | Discharge status: Home / Self Care | Payer: BLUE CROSS/BLUE SHIELD | Attending: Internal Medicine | Admitting: Internal Medicine

## 2017-03-21 DIAGNOSIS — F319 Bipolar disorder, unspecified: Secondary | ICD-10-CM | POA: Diagnosis not present

## 2017-03-21 DIAGNOSIS — A419 Sepsis, unspecified organism: Secondary | ICD-10-CM | POA: Diagnosis not present

## 2017-03-21 DIAGNOSIS — F17210 Nicotine dependence, cigarettes, uncomplicated: Secondary | ICD-10-CM | POA: Diagnosis not present

## 2017-03-21 DIAGNOSIS — N136 Pyonephrosis: Secondary | ICD-10-CM | POA: Diagnosis not present

## 2017-03-21 DIAGNOSIS — E871 Hypo-osmolality and hyponatremia: Secondary | ICD-10-CM | POA: Insufficient documentation

## 2017-03-21 DIAGNOSIS — K219 Gastro-esophageal reflux disease without esophagitis: Secondary | ICD-10-CM | POA: Diagnosis not present

## 2017-03-21 DIAGNOSIS — Z6831 Body mass index (BMI) 31.0-31.9, adult: Secondary | ICD-10-CM | POA: Diagnosis not present

## 2017-03-21 DIAGNOSIS — Z9851 Tubal ligation status: Secondary | ICD-10-CM | POA: Diagnosis not present

## 2017-03-21 DIAGNOSIS — R109 Unspecified abdominal pain: Secondary | ICD-10-CM

## 2017-03-21 DIAGNOSIS — N139 Obstructive and reflux uropathy, unspecified: Secondary | ICD-10-CM | POA: Insufficient documentation

## 2017-03-21 DIAGNOSIS — Z91040 Latex allergy status: Secondary | ICD-10-CM | POA: Diagnosis not present

## 2017-03-21 DIAGNOSIS — E669 Obesity, unspecified: Secondary | ICD-10-CM | POA: Diagnosis not present

## 2017-03-21 DIAGNOSIS — N201 Calculus of ureter: Secondary | ICD-10-CM | POA: Diagnosis not present

## 2017-03-21 DIAGNOSIS — N200 Calculus of kidney: Secondary | ICD-10-CM

## 2017-03-21 DIAGNOSIS — N390 Urinary tract infection, site not specified: Secondary | ICD-10-CM

## 2017-03-21 DIAGNOSIS — E876 Hypokalemia: Secondary | ICD-10-CM | POA: Insufficient documentation

## 2017-03-21 DIAGNOSIS — N12 Tubulo-interstitial nephritis, not specified as acute or chronic: Secondary | ICD-10-CM

## 2017-03-21 HISTORY — PX: CYSTOSCOPY WITH STENT PLACEMENT: SHX5790

## 2017-03-21 LAB — URINALYSIS, COMPLETE (UACMP) WITH MICROSCOPIC
Bacteria, UA: NONE SEEN
Bilirubin Urine: NEGATIVE
GLUCOSE, UA: NEGATIVE mg/dL
Ketones, ur: 80 mg/dL — AB
NITRITE: NEGATIVE
Protein, ur: 100 mg/dL — AB
SPECIFIC GRAVITY, URINE: 1.025 (ref 1.005–1.030)
pH: 5 (ref 5.0–8.0)

## 2017-03-21 LAB — PREGNANCY, URINE: Preg Test, Ur: NEGATIVE

## 2017-03-21 LAB — CBC
HEMATOCRIT: 39.5 % (ref 35.0–47.0)
HEMOGLOBIN: 13.7 g/dL (ref 12.0–16.0)
MCH: 30.1 pg (ref 26.0–34.0)
MCHC: 34.5 g/dL (ref 32.0–36.0)
MCV: 87.1 fL (ref 80.0–100.0)
Platelets: 213 10*3/uL (ref 150–440)
RBC: 4.54 MIL/uL (ref 3.80–5.20)
RDW: 12.9 % (ref 11.5–14.5)
WBC: 15.1 10*3/uL — AB (ref 3.6–11.0)

## 2017-03-21 LAB — BASIC METABOLIC PANEL
Anion gap: 10 (ref 5–15)
BUN: 17 mg/dL (ref 6–20)
CHLORIDE: 101 mmol/L (ref 101–111)
CO2: 23 mmol/L (ref 22–32)
Calcium: 8.9 mg/dL (ref 8.9–10.3)
Creatinine, Ser: 1.04 mg/dL — ABNORMAL HIGH (ref 0.44–1.00)
GFR calc Af Amer: >60 mL/min (ref >60)
GFR calc non Af Amer: >60 mL/min (ref >60)
GLUCOSE: 116 mg/dL — AB (ref 65–99)
POTASSIUM: 3.4 mmol/L — AB (ref 3.5–5.1)
Sodium: 134 mmol/L — ABNORMAL LOW (ref 135–145)

## 2017-03-21 LAB — LACTIC ACID, PLASMA
Lactic Acid, Venous: 0.7 mmol/L (ref 0.5–1.9)
Lactic Acid, Venous: 0.9 mmol/L (ref 0.5–1.9)

## 2017-03-21 LAB — DIFFERENTIAL
Basophils Absolute: 0.1 10*3/uL (ref 0–0.1)
Basophils Relative: 0 %
Eosinophils Absolute: 0 10*3/uL (ref 0–0.7)
Eosinophils Relative: 0 %
LYMPHS PCT: 4 %
Lymphs Abs: 0.6 10*3/uL — ABNORMAL LOW (ref 1.0–3.6)
MONO ABS: 1.2 10*3/uL — AB (ref 0.2–0.9)
MONOS PCT: 8 %
NEUTROS ABS: 13.2 10*3/uL — AB (ref 1.4–6.5)
Neutrophils Relative %: 88 %

## 2017-03-21 SURGERY — CYSTOSCOPY, WITH STENT INSERTION
Anesthesia: General | Site: Ureter | Laterality: Right | Wound class: Clean Contaminated

## 2017-03-21 MED ORDER — ACETAMINOPHEN 325 MG PO TABS
650.0000 mg | ORAL_TABLET | Freq: Once | ORAL | Status: AC
  Administered 2017-03-21: 650 mg via ORAL
  Filled 2017-03-21: qty 2

## 2017-03-21 MED ORDER — CEFTRIAXONE SODIUM 1 G IJ SOLR: 1.0000 g | Freq: Once | INTRAMUSCULAR | Status: DC

## 2017-03-21 MED ORDER — GLYCOPYRROLATE 0.2 MG/ML IJ SOLN
INTRAMUSCULAR | Status: DC | PRN
  Administered 2017-03-21: 0.2 mg via INTRAVENOUS

## 2017-03-21 MED ORDER — ONDANSETRON HCL 4 MG PO TABS
4.0000 mg | ORAL_TABLET | Freq: Once | ORAL | Status: AC
  Administered 2017-03-21: 4 mg via ORAL
  Filled 2017-03-21: qty 1

## 2017-03-21 MED ORDER — FENTANYL CITRATE (PF) 100 MCG/2ML IJ SOLN: 25.0000 ug | INTRAMUSCULAR | Status: DC | PRN

## 2017-03-21 MED ORDER — GLYCOPYRROLATE 0.2 MG/ML IJ SOLN
INTRAMUSCULAR | Status: AC
  Filled 2017-03-21: qty 1

## 2017-03-21 MED ORDER — PROPOFOL 10 MG/ML IV BOLUS
INTRAVENOUS | Status: DC | PRN
  Administered 2017-03-21: 150 mg via INTRAVENOUS

## 2017-03-21 MED ORDER — SUCCINYLCHOLINE CHLORIDE 20 MG/ML IJ SOLN
INTRAMUSCULAR | Status: DC | PRN
  Administered 2017-03-21: 100 mg via INTRAVENOUS

## 2017-03-21 MED ORDER — LIDOCAINE HCL (CARDIAC) 20 MG/ML IV SOLN
INTRAVENOUS | Status: DC | PRN
  Administered 2017-03-21: 50 mg via INTRAVENOUS

## 2017-03-21 MED ORDER — LACTATED RINGERS IV SOLN
INTRAVENOUS | Status: DC | PRN
  Administered 2017-03-21: 23:00:00 via INTRAVENOUS

## 2017-03-21 MED ORDER — DEXAMETHASONE SODIUM PHOSPHATE 10 MG/ML IJ SOLN
INTRAMUSCULAR | Status: DC | PRN
  Administered 2017-03-21: 4 mg via INTRAVENOUS

## 2017-03-21 MED ORDER — DEXTROSE 5 % IV SOLN
1.0000 g | Freq: Once | INTRAVENOUS | Status: AC
  Administered 2017-03-21: 1 g via INTRAVENOUS
  Filled 2017-03-21: qty 10

## 2017-03-21 MED ORDER — FENTANYL CITRATE (PF) 100 MCG/2ML IJ SOLN
INTRAMUSCULAR | Status: DC | PRN
  Administered 2017-03-21 (×2): 50 ug via INTRAVENOUS

## 2017-03-21 MED ORDER — ONDANSETRON HCL 4 MG/2ML IJ SOLN: 4.0000 mg | Freq: Once | INTRAMUSCULAR | Status: DC | PRN

## 2017-03-21 MED ORDER — ONDANSETRON HCL 4 MG/2ML IJ SOLN
INTRAMUSCULAR | Status: DC | PRN
  Administered 2017-03-21: 4 mg via INTRAVENOUS

## 2017-03-21 MED ORDER — FENTANYL CITRATE (PF) 100 MCG/2ML IJ SOLN
INTRAMUSCULAR | Status: AC
  Filled 2017-03-21: qty 2

## 2017-03-21 MED ORDER — KETOROLAC TROMETHAMINE 30 MG/ML IJ SOLN
30.0000 mg | Freq: Once | INTRAMUSCULAR | Status: AC
  Administered 2017-03-21: 30 mg via INTRAVENOUS
  Filled 2017-03-21: qty 1

## 2017-03-21 MED ORDER — LIDOCAINE HCL (PF) 2 % IJ SOLN
INTRAMUSCULAR | Status: AC
  Filled 2017-03-21: qty 2

## 2017-03-21 MED ORDER — DEXAMETHASONE SODIUM PHOSPHATE 10 MG/ML IJ SOLN
INTRAMUSCULAR | Status: AC
  Filled 2017-03-21: qty 1

## 2017-03-21 MED ORDER — SODIUM CHLORIDE 0.9 % IV BOLUS (SEPSIS)
1000.0000 mL | Freq: Once | INTRAVENOUS | Status: AC
  Administered 2017-03-21: 1000 mL via INTRAVENOUS

## 2017-03-21 MED ORDER — ONDANSETRON HCL 4 MG/2ML IJ SOLN
INTRAMUSCULAR | Status: AC
  Filled 2017-03-21: qty 2

## 2017-03-21 SURGICAL SUPPLY — 18 items
BAG DRAIN CYSTO-URO LG1000N (MISCELLANEOUS) ×3 IMPLANT
CANISTER SUCT LVC 12 LTR MEDI- (MISCELLANEOUS) ×3 IMPLANT
DRAPE XRAY CASSETTE 23X24 (DRAPES) ×3 IMPLANT
GOWN STRL REUS W/ TWL LRG LVL4 (GOWN DISPOSABLE) ×1 IMPLANT
GOWN STRL REUS W/ TWL XL LVL3 (GOWN DISPOSABLE) ×1 IMPLANT
GOWN STRL REUS W/TWL LRG LVL4 (GOWN DISPOSABLE) ×2
GOWN STRL REUS W/TWL XL LVL3 (GOWN DISPOSABLE) ×2
KIT RM TURNOVER CYSTO AR (KITS) ×3 IMPLANT
NS IRRIG 500ML POUR BTL (IV SOLUTION) ×3 IMPLANT
PACK CYSTO AR (MISCELLANEOUS) ×3 IMPLANT
SENSORWIRE 0.038 NOT ANGLED (WIRE) ×6
SET CYSTO W/LG BORE CLAMP LF (SET/KITS/TRAYS/PACK) ×3 IMPLANT
SOL .9 NS 3000ML IRR  AL (IV SOLUTION) ×2
SOL .9 NS 3000ML IRR UROMATIC (IV SOLUTION) ×1 IMPLANT
STENT URET 6FRX24 CONTOUR (STENTS) IMPLANT
STENT URET 6FRX26 CONTOUR (STENTS) ×3 IMPLANT
WATER STERILE IRR 1000ML POUR (IV SOLUTION) ×3 IMPLANT
WIRE SENSOR 0.038 NOT ANGLED (WIRE) ×2 IMPLANT

## 2017-03-21 NOTE — Anesthesia Procedure Notes (Signed)
Procedure Name: Intubation Performed by: Isobelle Tuckett Pre-anesthesia Checklist: Patient identified, Patient being monitored, Timeout performed, Emergency Drugs available and Suction available Patient Re-evaluated:Patient Re-evaluated prior to inductionOxygen Delivery Method: Circle system utilized Preoxygenation: Pre-oxygenation with 100% oxygen Intubation Type: IV induction and Rapid sequence Laryngoscope Size: Miller and 2 Grade View: Grade I Tube type: Oral Tube size: 7.0 mm Number of attempts: 1 Airway Equipment and Method: Stylet Placement Confirmation: ETT inserted through vocal cords under direct vision,  positive ETCO2 and breath sounds checked- equal and bilateral Secured at: 21 cm Tube secured with: Tape Dental Injury: Teeth and Oropharynx as per pre-operative assessment        

## 2017-03-21 NOTE — Transfer of Care (Signed)
Immediate Anesthesia Transfer of Care Note  Patient: Kara [name]  Procedure(s) Performed: Procedure(s): CYSTOSCOPY WITH STENT PLACEMENT (Right)  Patient Location: PACU  Anesthesia Type:General  Level of Consciousness: awake  Airway & Oxygen Therapy: Patient Spontanous Breathing and Patient connected to face mask oxygen  Post-op Assessment: Report given to RN and Post -op Vital signs reviewed and stable  Post vital signs: Reviewed  Last Vitals:  Vitals:   03/21/17 1735 03/21/17 2303  BP: (!) 145/91 105/61  Pulse: (!) 106 (!) 112  Resp: 20 15  Temp: (!) 38.2 C 37.1 C    Last Pain:  Vitals:   03/21/17 2118  TempSrc:   PainSc: 8          Complications: No apparent anesthesia complications

## 2017-03-21 NOTE — ED Provider Notes (Signed)
[facility] Emergency Department Provider Note  ____________________________________________   First MD Initiated Contact with Patient 03/21/17 2004     (approximate)  I have reviewed the triage vital signs and the nursing notes.   HISTORY  Chief Complaint Flank Pain and Dysuria   HPI Denny Peonrin Teodora MediciUhler Lynch is a 36 y.o. female with a history of bipolar disorder as well as UTIs who is presenting to the emergency department today with 4 days of burning with urination which has developed into right sided flank pain and dry heaves. She says the pain is a 7 out of 10 at this time but occasionally will get up to a 10 out of 10 pain to her right lower quadrant as well as the right side of her low back. She has no history of kidney stones.   Past Medical History:  Diagnosis Date  . Bipolar disorder (HCC)   . Depression   . GERD (gastroesophageal reflux disease)   . Hair loss   . Irregular menstrual cycle   . Migraines   . Obesity   . Palpitation     Patient Active Problem List   Diagnosis Date Noted  . Abdominal bloating 10/18/2016  . Increased heart rate 09/21/2016  . Bipolar depression (HCC) 08/30/2016  . Gastro-esophageal reflux disease without esophagitis 04/10/2015  . Migraine without aura and responsive to treatment 04/10/2015  . Obesity (BMI 30-39.9) 04/10/2015  . Cold sore 04/10/2015    Past Surgical History:  Procedure Laterality Date  . DILATION AND CURETTAGE OF UTERUS  12/27/2013  . TUBAL LIGATION      Prior to Admission medications   Medication Sig Start Date End Date Taking? Authorizing Provider  ARIPiprazole (ABILIFY) 15 MG tablet Take 1 tablet (15 mg total) by mouth daily. 01/17/17   Waldon MerlMartin, William C, PA-C    Allergies Latex  Family History  Problem Relation Age of Onset  . Metabolic syndrome Mother   . Cancer Mother        Ovarian and Cervical  . COPD Father     Social History Social History  Substance Use Topics  .  Smoking status: Current Every Day Smoker    Packs/day: 0.50    Years: 2.00    Types: Cigarettes  . Smokeless tobacco: Never Used  . Alcohol use 0.0 oz/week     Comment: rare    Review of Systems  Constitutional: fever Eyes: No visual changes. ENT: No sore throat. Cardiovascular: Denies chest pain. Respiratory: Denies shortness of breath. Gastrointestinal:   No diarrhea.  No constipation. Genitourinary: as above Musculoskeletal: as above. Skin: Negative for rash. Neurological: Negative for headaches, focal weakness or numbness.   ____________________________________________   PHYSICAL EXAM:  VITAL SIGNS: ED Triage Vitals  Enc Vitals Group     BP 03/21/17 1735 (!) 145/91     Pulse Rate 03/21/17 1735 (!) 106     Resp 03/21/17 1735 20     Temp 03/21/17 1735 (!) 100.7 F (38.2 C)     Temp Source 03/21/17 1735 Oral     SpO2 03/21/17 1735 97 %     Weight 03/21/17 1735 178 lb (80.7 kg)     Height 03/21/17 1735 5\' 3"  (1.6 m)     Head Circumference --      Peak Flow --      Pain Score 03/21/17 1741 7     Pain Loc --      Pain Edu? --      Excl. in  GC? --     Constitutional: Alert and oriented. Well appearing and in no acute distress. Eyes: Conjunctivae are normal.  Head: Atraumatic. Nose: No congestion/rhinnorhea. Mouth/Throat: Mucous membranes are moist.  Neck: No stridor.   Cardiovascular: Tachycardic, regular rhythm. Grossly normal heart sounds.   Respiratory: Normal respiratory effort.  No retractions. Lungs CTAB. Gastrointestinal: Soft With minimal right lower quadrant tenderness palpation.. No distention. Right-sided CVA tenderness which is moderate. Minimal left-sided CVA tenderness to palpation. Musculoskeletal: No lower extremity tenderness nor edema.  No joint effusions. Neurologic:  Normal speech and language. No gross focal neurologic deficits are appreciated. Skin:  Skin is warm, dry and intact. No rash noted. Psychiatric: Mood and affect are normal.  Speech and behavior are normal.  ____________________________________________   LABS (all labs ordered are listed, but only abnormal results are displayed)  Labs Reviewed  URINALYSIS, COMPLETE (UACMP) WITH MICROSCOPIC - Abnormal; Notable for the following:       Result Value   Color, Urine YELLOW (*)    APPearance HAZY (*)    Hgb urine dipstick MODERATE (*)    Ketones, ur 80 (*)    Protein, ur 100 (*)    Leukocytes, UA MODERATE (*)    Squamous Epithelial / LPF 0-5 (*)    All other components within normal limits  BASIC METABOLIC PANEL - Abnormal; Notable for the following:    Sodium 134 (*)    Potassium 3.4 (*)    Glucose, Bld 116 (*)    Creatinine, Ser 1.04 (*)    All other components within normal limits  CBC - Abnormal; Notable for the following:    WBC 15.1 (*)    All other components within normal limits  URINE CULTURE  CULTURE, BLOOD (ROUTINE X 2)  CULTURE, BLOOD (ROUTINE X 2)  PREGNANCY, URINE  CBC WITH DIFFERENTIAL/PLATELET  LACTIC ACID, PLASMA  LACTIC ACID, PLASMA   ____________________________________________  EKG   ____________________________________________  RADIOLOGY  1.3 cm UPJ stone with obstruction on the right. ____________________________________________   PROCEDURES  Procedure(s) performed:   Procedures  Critical Care performed:  CRITICAL CARE Performed by: Arelia Longest   Total critical care time: 35 minutes  Critical care time was exclusive of separately billable procedures and treating other patients.  Critical care was necessary to treat or prevent imminent or life-threatening deterioration.  Critical care was time spent personally by me on the following activities: development of treatment plan with patient and/or surrogate as well as nursing, discussions with consultants, evaluation of patient's response to treatment, examination of patient, obtaining history from patient or surrogate, ordering and performing treatments  and interventions, ordering and review of laboratory studies, ordering and review of radiographic studies, pulse oximetry and re-evaluation of patient's condition.  ____________________________________________   INITIAL IMPRESSION / ASSESSMENT AND PLAN / ED COURSE  Pertinent labs & imaging results that were available during my care of the patient were reviewed by me and considered in my medical decision making (see chart for details).  ----------------------------------------- 9:48 PM on 03/21/2017 -----------------------------------------  Patient's septic with kidney stone. Sepsis code called. Discussed case with Dr. Annabell Howells of urology who will be taking the patient directly to the operating room for a stent procedure. He requests that medicine admit the patient because of the sepsis. Discussed case with Dr. Emmit Pomfret.  Patient understands the plan and disposition and is willing to comply. Last ate one day ago.      ____________________________________________   FINAL CLINICAL IMPRESSION(S) / ED DIAGNOSES  Final  diagnoses:  Right flank pain   Sepsis. Pyelonephritis. Kidney stone.   NEW MEDICATIONS STARTED DURING THIS VISIT:  New Prescriptions   No medications on file     Note:  This document was prepared using Dragon voice recognition software and may include unintentional dictation errors.     Myrna Blazer, MD 03/21/17 [phone]

## 2017-03-21 NOTE — Anesthesia Preprocedure Evaluation (Signed)
Anesthesia Evaluation  Patient identified by MRN, date of birth, ID band Patient awake    Reviewed: Allergy & Precautions, NPO status , Patient's Chart, lab work & pertinent test results, reviewed documented beta blocker date and time   Airway Mallampati: III  TM Distance: >3 FB     Dental  (+) Chipped   Pulmonary Current Smoker,           Cardiovascular      Neuro/Psych  Headaches, PSYCHIATRIC DISORDERS Depression Bipolar Disorder    GI/Hepatic GERD  Controlled,  Endo/Other    Renal/GU      Musculoskeletal   Abdominal   Peds  Hematology   Anesthesia Other Findings Obese.  Reproductive/Obstetrics                             Anesthesia Physical Anesthesia Plan  ASA: III  Anesthesia Plan: General   Post-op Pain Management:    Induction: Intravenous  PONV Risk Score and Plan:   Airway Management Planned: Oral ETT and LMA  Additional Equipment:   Intra-op Plan:   Post-operative Plan:   Informed Consent: I have reviewed the patients History and Physical, chart, labs and discussed the procedure including the risks, benefits and alternatives for the proposed anesthesia with the patient or authorized representative who has indicated his/her understanding and acceptance.     Plan Discussed with: CRNA  Anesthesia Plan Comments:         Anesthesia Quick Evaluation

## 2017-03-21 NOTE — ED Triage Notes (Signed)
Pt reports right side flank pain, pain and burning with urination for three days. Pt reports nausea and vomiting as well.

## 2017-03-21 NOTE — Anesthesia Post-op Follow-up Note (Cosign Needed)
Anesthesia QCDR form completed.        

## 2017-03-21 NOTE — ED Notes (Signed)
Notified nurse Brandy from OR regarding 2nd blood culture and code sepsis. Transporter at bedside for OR.

## 2017-03-21 NOTE — Discharge Instructions (Signed)

## 2017-03-21 NOTE — H&P (Signed)
Subjective: CC: Right flank pain   Hx:  Ms. Kara [name] is a 36 yo female who I was asked to see in consultation by Dr. Pershing Proud for a 1.3cm right UPJ stone with obstruction and signs of sepsis.   She had the onset 4 days ago of dysuria which was followed later by severe right flank pain with nausea and dry heaves.  She had a low grade fever at home to 100.5.   In the ER was found to be febrile and tachycardic with an infected urine and a CT with the obstructing stone.  She has no prior history of stones but she has had UTI's.   ROS:  Review of Systems  Constitutional: Positive for fever.  Gastrointestinal: Positive for abdominal pain and nausea.  Genitourinary: Positive for dysuria and flank pain.  All other systems reviewed and are negative.   Allergies  Allergen Reactions  . Latex Rash    Past Medical History:  Diagnosis Date  . Bipolar disorder (HCC)   . Depression   . GERD (gastroesophageal reflux disease)   . Hair loss   . Irregular menstrual cycle   . Migraines   . Obesity   . Palpitation     Past Surgical History:  Procedure Laterality Date  . DILATION AND CURETTAGE OF UTERUS  12/27/2013  . TUBAL LIGATION      Social History   Social History  . Marital status: Married    Spouse name: N/A  . Number of children: N/A  . Years of education: N/A   Occupational History  . Not on file.   Social History Main Topics  . Smoking status: Current Every Day Smoker    Packs/day: 0.50    Years: 2.00    Types: Cigarettes  . Smokeless tobacco: Never Used  . Alcohol use 0.0 oz/week     Comment: rare  . Drug use: No  . Sexual activity: Yes    Partners: Female   Other Topics Concern  . Not on file   Social History Narrative  . No narrative on file    Family History  Problem Relation Age of Onset  . Metabolic syndrome Mother   . Cancer Mother        Ovarian and Cervical  . COPD Father     Anti-infectives: Anti-infectives    Start     Dose/Rate Route  Frequency Ordered Stop   03/21/17 2200  cefTRIAXone (ROCEPHIN) 1 g in dextrose 5 % 50 mL IVPB     1 g 100 mL/hr over 30 Minutes Intravenous  Once 03/21/17 2147     03/21/17 2030  cefTRIAXone (ROCEPHIN) 1 g in dextrose 5 % 50 mL IVPB     1 g 100 mL/hr over 30 Minutes Intravenous  Once 03/21/17 2019 03/21/17 2211      Current Facility-Administered Medications  Medication Dose Route Frequency Provider Last Rate Last Dose  . cefTRIAXone (ROCEPHIN) 1 g in dextrose 5 % 50 mL IVPB  1 g Intravenous Once Schaevitz, Myra Rude, MD       Current Outpatient Prescriptions  Medication Sig Dispense Refill  . ARIPiprazole (ABILIFY) 15 MG tablet Take 1 tablet (15 mg total) by mouth daily. (Patient not taking: Reported on 03/21/2017) 30 tablet 5   I have reviewed the past medical, surgical, family and social history.   Objective: Vital signs in last 24 hours: Temp:  [100.7 F (38.2 C)] 100.7 F (38.2 C) (06/11 1735) Pulse Rate:  [106] 106 (06/11 1735) Resp:  [  20] 20 (06/11 1735) BP: (145)/(91) 145/91 (06/11 1735) SpO2:  [97 %] 97 % (06/11 1735) Weight:  [80.7 kg (178 lb)] 80.7 kg (178 lb) (06/11 1735)  Intake/Output from previous day: No intake/output data recorded. Intake/Output this shift: Total I/O In: 1050 [IV Piggyback:1050] Out: -    Physical Exam  Constitutional: She is oriented to person, place, and time and well-developed, well-nourished, and in no distress.  HENT:  Head: Normocephalic and atraumatic.  Neck: Normal range of motion. Neck supple. No thyromegaly present.  Cardiovascular: Regular rhythm.   Sinus tach  Pulmonary/Chest: Effort normal and breath sounds normal. No respiratory distress.  Abdominal: Soft. There is tenderness (right CVA and RUQ mild).  Musculoskeletal: Normal range of motion. She exhibits no edema or tenderness.  Lymphadenopathy:    She has no cervical adenopathy.  Neurological: She is alert and oriented to person, place, and time.  Skin: Skin is  warm and dry.  flushing  Psychiatric: Mood and affect normal.  Vitals reviewed.   Lab Results:   Recent Labs  03/21/17 1741  WBC 15.1*  HGB 13.7  HCT 39.5  PLT 213   BMET  Recent Labs  03/21/17 1741  NA 134*  K 3.4*  CL 101  CO2 23  GLUCOSE 116*  BUN 17  CREATININE 1.04*  CALCIUM 8.9   PT/INR No results for input(s): LABPROT, INR in the last 72 hours. ABG No results for input(s): PHART, HCO3 in the last 72 hours.  Invalid input(s): PCO2, PO2  Studies/Results: Ct Renal Stone Study  Result Date: 03/21/2017 CLINICAL DATA:  Right flank pain 4 days.  Fever. EXAM: CT ABDOMEN AND PELVIS WITHOUT CONTRAST TECHNIQUE: Multidetector CT imaging of the abdomen and pelvis was performed following the standard protocol without IV contrast. COMPARISON:  None. FINDINGS: Lower chest: Within normal. Hepatobiliary: Normal. Pancreas: Normal. Spleen: Normal. Adrenals/Urinary Tract: Adrenal glands are normal. Kidneys normal in size. No left renal stones. There are 2 small right renal stones over the mid to lower pole of the collecting system. There is a 1.3 cm stone over the right renal pelvis near the UPJ causing low-grade obstruction. The left ureter and remainder of the right ureter are within normal. Stomach/Bowel: Stomach and small bowel are within normal. Appendix is normal. Colon is normal. Focal narrowing of the distal transverse colon likely due to spasm/peristalsis. Vascular/Lymphatic: Within normal. Reproductive: Within normal. Other: No significant free fluid. Musculoskeletal: Within normal. IMPRESSION: Right-sided nephrolithiasis. 1.3 cm stone over the right renal pelvis near the UPJ causing low-grade obstruction. Electronically Signed   By: Elberta Fortisaniel  Boyle M.D.   On: 03/21/2017 21:06   I have discussed the case with Dr. Pershing ProudSchaevitz and I have reviewed the CT films and report as well as her labs and UA.    Assessment: Right UPJ stone with sepsis of urinary origin.    I am going to do  cystoscopy with right stent insertion for relief of obstruction.  She will need ESWL or ureteroscopy at a later date.  I have reviewed the risks of bleeding, infection, ureteral injury, need for secondary procedures, possible inability to access the kidney with the need for a perc, thrombotic events and anesthetic complications.      She will be admitted to the hospitalist service for management of the sepsis.    CC: Dr. Gladstone Pihavid Schaevitz    Anner CreteWRENN,Kara [name] 03/21/2017 [phone]

## 2017-03-21 NOTE — H&P (Addendum)
History and Physical   [facility] Admission History and Physical [employer] Nicola Heinemann, D.O.    Patient Name: Kara [name] MR#: [id number] Date of Birth: Feb 08, 1981 Date of Admission: 03/21/2017  Referring MD/NP/PA: Dr. Pershing ProudSchaevitz Primary Care Physician: Waldon MerlMartin, William C, PA-C Patient coming from: Home  Chief Complaint:  Chief Complaint  Patient presents with  . Flank Pain  . Dysuria    HPI: Kara [name] is a 36 y.o. female with a known history of Bipolar disorder, depression, GERD presents to the emergency department for evaluation of flank pain.  Patient was in a usual state of health until 3 days ago when she describes the onset of dysuria associated with right-sided flank pain radiating around to the groin, nausea and fever.She did not take any medications for her symptoms and did not seek any medical attention until today. He has an urinary tract infections in the past but never has had prior low, nephrolithiasis or hydronephrosis  Patient denies weakness, dizziness, chest pain, shortness of breath, vomiting, coffee, diarrhea, changes in mental status.    Otherwise there has been no change in status. Patient has been taking medication as prescribed and there has been no recent change in medication or diet.  No recent antibiotics.  There has been no recent illness, hospitalizations, travel or sick contacts.    EMS/ED Course: Patient received Tylenol, Toradol, Zofran, Rocephin, normal saline with significant improvement in her pain. In the emergency department patient was found to be septic with a  1.3 cm right UPJ stone with low-grade obstruction. Medical admission was requested and she was taken to the OR for urgent cystoscopy with stent placement   Review of Systems:  CONSTITUTIONAL:  positive fever. No  fatigue, weakness, weight gain/loss, headache. EYES: No blurry or double vision. ENT: No tinnitus, postnasal drip, redness or soreness of the  oropharynx. RESPIRATORY: No cough, dyspnea, wheeze.  No hemoptysis.  CARDIOVASCULAR: No chest pain, palpitations, syncope, orthopnea. No lower extremity edema.  GASTROINTESTINAL: positive nausea, no vomiting, abdominal pain, diarrhea, constipation.  No hematemesis, melena or hematochezia. GENITOURINARY: Positive dysuria, flank pain on the right. No  frequency, hematuria. ENDOCRINE: No polyuria or nocturia. No heat or cold intolerance. HEMATOLOGY: No anemia, bruising, bleeding. INTEGUMENTARY: No rashes, ulcers, lesions. MUSCULOSKELETAL: No arthritis, gout, dyspnea. NEUROLOGIC: No numbness, tingling, ataxia, seizure-type activity, weakness. PSYCHIATRIC: No anxiety, depression, insomnia.   Past Medical History:  Diagnosis Date  . Bipolar disorder (HCC)   . Depression   . GERD (gastroesophageal reflux disease)   . Hair loss   . Irregular menstrual cycle   . Migraines   . Obesity   . Palpitation     Past Surgical History:  Procedure Laterality Date  . DILATION AND CURETTAGE OF UTERUS  12/27/2013  . TUBAL LIGATION       reports that she has been smoking Cigarettes.  She has a 1.00 pack-year smoking history. She has never used smokeless tobacco. She reports that she drinks alcohol. She reports that she does not use drugs.  Allergies  Allergen Reactions  . Latex Rash    Family History  Problem Relation Age of Onset  . Metabolic syndrome Mother   . Cancer Mother        Ovarian and Cervical  . COPD Father     Prior to Admission medications   Medication Sig Start Date End Date Taking? Authorizing Provider  ARIPiprazole (ABILIFY) 15 MG tablet Take 1 tablet (15 mg total) by mouth daily. 01/17/17  Waldon Merl, PA-C    Physical Exam: Vitals:   03/21/17 1735  BP: (!) 145/91  Pulse: (!) 106  Resp: 20  Temp: (!) 100.7 F (38.2 C)  TempSrc: Oral  SpO2: 97%  Weight: 80.7 kg (178 lb)  Height: 5\' 3"  (1.6 m)    GENERAL: 36 y.o.-year-old  white femalent, well-developed,  well-nourished lying in the bed in no acute distress.  Pleasant and cooperative.   HEENT: Head atraumatic, normocephalic. Pupils equal, round, reactive to light and accommodation. No scleral icterus. Extraocular muscles intact. Nares are patent. Oropharynx is clear. Mucus membranes moist. NECK: Supple, full range of motion. No JVD, no bruit heard. No thyroid enlargement, no tenderness, no cervical lymphadenopathy. CHEST: Normal breath sounds bilaterally. No wheezing, rales, rhonchi or crackles. No use of accessory muscles of respiration.  No reproducible chest wall tenderness.  CARDIOVASCULAR: S1, S2 normal. No murmurs, rubs, or gallops. Cap refill <2 seconds. Pulses intact distally.  ABDOMEN: Positive right-sided CVA tenderness Soft, nondistended, nontender. No rebound, guarding, rigidity. Normoactive bowel sounds present in all four quadrants. No organomegaly or mass. EXTREMITIES: No pedal edema, cyanosis, or clubbing. No calf tenderness or Homan's sign.  NEUROLOGIC: The patient is alert and oriented x 3. Cranial nerves II through XII are grossly intact with no focal sensorimotor deficit. PSYCHIATRIC:  Normal affect, mood, thought content. SKIN: Warm, dry, and intact without obvious rash, lesion, or ulcer.    Labs on Admission:  CBC:  Recent Labs Lab 03/21/17 1741  WBC 15.1*  HGB 13.7  HCT 39.5  MCV 87.1  PLT 213   Basic Metabolic Panel:  Recent Labs Lab 03/21/17 1741  NA 134*  K 3.4*  CL 101  CO2 23  GLUCOSE 116*  BUN 17  CREATININE 1.04*  CALCIUM 8.9   GFR: Estimated Creatinine Clearance: 76.6 mL/min (A) (by C-G formula based on SCr of 1.04 mg/dL (H)). Liver Function Tests: No results for input(s): AST, ALT, ALKPHOS, BILITOT, PROT, ALBUMIN in the last 168 hours. No results for input(s): LIPASE, AMYLASE in the last 168 hours. No results for input(s): AMMONIA in the last 168 hours. Coagulation Profile: No results for input(s): INR, PROTIME in the last 168  hours. Cardiac Enzymes: No results for input(s): CKTOTAL, CKMB, CKMBINDEX, TROPONINI in the last 168 hours. BNP (last 3 results) No results for input(s): PROBNP in the last 8760 hours. HbA1C: No results for input(s): HGBA1C in the last 72 hours. CBG: No results for input(s): GLUCAP in the last 168 hours. Lipid Profile: No results for input(s): CHOL, HDL, LDLCALC, TRIG, CHOLHDL, LDLDIRECT in the last 72 hours. Thyroid Function Tests: No results for input(s): TSH, T4TOTAL, FREET4, T3FREE, THYROIDAB in the last 72 hours. Anemia Panel: No results for input(s): VITAMINB12, FOLATE, FERRITIN, TIBC, IRON, RETICCTPCT in the last 72 hours. Urine analysis:    Component Value Date/Time   COLORURINE YELLOW (A) 03/21/2017 1742   APPEARANCEUR HAZY (A) 03/21/2017 1742   LABSPEC 1.025 03/21/2017 1742   PHURINE 5.0 03/21/2017 1742   GLUCOSEU NEGATIVE 03/21/2017 1742   HGBUR MODERATE (A) 03/21/2017 1742   BILIRUBINUR NEGATIVE 03/21/2017 1742   KETONESUR 80 (A) 03/21/2017 1742   PROTEINUR 100 (A) 03/21/2017 1742   NITRITE NEGATIVE 03/21/2017 1742   LEUKOCYTESUR MODERATE (A) 03/21/2017 1742   Sepsis Labs: @LABRCNTIP (procalcitonin:4,lacticidven:4) )No results found for this or any previous visit (from the past 240 hour(s)).   Radiological Exams on Admission: Ct Renal Stone Study  Result Date: 03/21/2017 CLINICAL DATA:  Right flank pain 4  days.  Fever. EXAM: CT ABDOMEN AND PELVIS WITHOUT CONTRAST TECHNIQUE: Multidetector CT imaging of the abdomen and pelvis was performed following the standard protocol without IV contrast. COMPARISON:  None. FINDINGS: Lower chest: Within normal. Hepatobiliary: Normal. Pancreas: Normal. Spleen: Normal. Adrenals/Urinary Tract: Adrenal glands are normal. Kidneys normal in size. No left renal stones. There are 2 small right renal stones over the mid to lower pole of the collecting system. There is a 1.3 cm stone over the right renal pelvis near the UPJ causing low-grade  obstruction. The left ureter and remainder of the right ureter are within normal. Stomach/Bowel: Stomach and small bowel are within normal. Appendix is normal. Colon is normal. Focal narrowing of the distal transverse colon likely due to spasm/peristalsis. Vascular/Lymphatic: Within normal. Reproductive: Within normal. Other: No significant free fluid. Musculoskeletal: Within normal. IMPRESSION: Right-sided nephrolithiasis. 1.3 cm stone over the right renal pelvis near the UPJ causing low-grade obstruction. Electronically Signed   By: Elberta Fortis M.D.   On: 03/21/2017 21:06    EKG: Pending.   Assessment/Plan  This is a 36 y.o. female with a history of Bipolar disorder, depression, GERD now being admitted with:  #. Sepsis secondary to pyelonephritis - Admit to inpatient  - IV antibiotics: Rocephin -Antipyretics, antiemetics as needed - IV fluid hydration - Follow up blood,urine cultures - Repeat CBC in am.   #. Obstructive uropathy -Urology consult appreciated. Patient to OR for cystoscopy with stent placement. -Nothing by mouth -Pain control -Continue Rocephin  #. Hyponatremia and hypokalemia, mild - Replace by vein - Recheck BMP in AM  #. History of bipolar disorder/depression -Continue Abilify  Admission Status: Inpatient Diet/Nutrition: NPO Consults called: Urology  DVT Px: SCDs and early ambulation. Code Status: Full Code  Disposition Plan: To home in 1-2 days  All the records are reviewed and case discussed with ED provider. Management plans discussed with the patient and/or family who express understanding and agree with plan of care.  Kamille Toomey D.O. on 03/21/2017 at 9:48 PM Between 7am to 6pm - Pager - [phone] After 6pm go to [email or web address] - Administrator, Civil Service [facility] [place] [facility] Office [phone] CC: Primary care physician; Noel Journey   03/21/2017, 9:48 PM

## 2017-03-21 NOTE — Brief Op Note (Signed)
03/21/2017  10:50 PM  PATIENT:  Ander Sladerin Uhler Lynch  36 y.o. female  PRE-OPERATIVE DIAGNOSIS:  stone, UTI  POST-OPERATIVE DIAGNOSIS: RUPJ stone with pyonephrosis and sepsis. PROCEDURE:  Procedure(s): CYSTOSCOPY WITH STENT PLACEMENT (Right)  SURGEON:  Surgeon(s) and Role:    * Bjorn PippinWrenn, Venia Riveron, MD - Primary  PHYSICIAN ASSISTANT:   ASSISTANTS: none   ANESTHESIA:   general  EBL:  Total I/O In: 1050 [IV Piggyback:1050] Out: -   BLOOD ADMINISTERED:none  DRAINS: 6 x 26 right JJ stent   LOCAL MEDICATIONS USED:  NONE  SPECIMEN:  Source of Specimen:  Right renal pelvic urine for culture  DISPOSITION OF SPECIMEN:  PATHOLOGY  COUNTS:  YES  TOURNIQUET:  * No tourniquets in log *  DICTATION: .Other Dictation: Dictation Number [id number]  PLAN OF CARE: Admit for overnight observation  PATIENT DISPOSITION:  PACU - hemodynamically stable.   Delay start of Pharmacological VTE agent (>24hrs) due to surgical blood loss or risk of bleeding: not applicable

## 2017-03-21 NOTE — ED Triage Notes (Signed)
[facility], HR 125, t 100, nausea, sx started 2 days ago.

## 2017-03-22 ENCOUNTER — Ambulatory Visit: Payer: BLUE CROSS/BLUE SHIELD | Admitting: Physician Assistant

## 2017-03-22 DIAGNOSIS — R10A1 Flank pain, right side: Secondary | ICD-10-CM

## 2017-03-22 DIAGNOSIS — R109 Unspecified abdominal pain: Secondary | ICD-10-CM

## 2017-03-22 DIAGNOSIS — A419 Sepsis, unspecified organism: Secondary | ICD-10-CM | POA: Diagnosis present

## 2017-03-22 DIAGNOSIS — N200 Calculus of kidney: Secondary | ICD-10-CM | POA: Diagnosis not present

## 2017-03-22 LAB — COMPREHENSIVE METABOLIC PANEL
ALT: 16 U/L (ref 14–54)
AST: 18 U/L (ref 15–41)
Albumin: 3.4 g/dL — ABNORMAL LOW (ref 3.5–5.0)
Alkaline Phosphatase: 43 U/L (ref 38–126)
Anion gap: 6 (ref 5–15)
BILIRUBIN TOTAL: 0.9 mg/dL (ref 0.3–1.2)
BUN: 14 mg/dL (ref 6–20)
CO2: 25 mmol/L (ref 22–32)
CREATININE: 0.86 mg/dL (ref 0.44–1.00)
Calcium: 8.2 mg/dL — ABNORMAL LOW (ref 8.9–10.3)
Chloride: 107 mmol/L (ref 101–111)
GFR calc Af Amer: >60 mL/min (ref >60)
Glucose, Bld: 137 mg/dL — ABNORMAL HIGH (ref 65–99)
POTASSIUM: 4.1 mmol/L (ref 3.5–5.1)
Sodium: 138 mmol/L (ref 135–145)
TOTAL PROTEIN: 6.6 g/dL (ref 6.5–8.1)

## 2017-03-22 LAB — CBC
HEMATOCRIT: 37.1 % (ref 35.0–47.0)
Hemoglobin: 12.7 g/dL (ref 12.0–16.0)
MCH: 30.3 pg (ref 26.0–34.0)
MCHC: 34.2 g/dL (ref 32.0–36.0)
MCV: 88.5 fL (ref 80.0–100.0)
Platelets: 202 10*3/uL (ref 150–440)
RBC: 4.19 MIL/uL (ref 3.80–5.20)
RDW: 12.9 % (ref 11.5–14.5)
WBC: 17 10*3/uL — AB (ref 3.6–11.0)

## 2017-03-22 MED ORDER — SENNOSIDES-DOCUSATE SODIUM 8.6-50 MG PO TABS: 1.0000 | ORAL_TABLET | Freq: Every evening | ORAL | Status: DC | PRN

## 2017-03-22 MED ORDER — OXYCODONE-ACETAMINOPHEN 5-325 MG PO TABS: 1.0000 | ORAL_TABLET | Freq: Four times a day (QID) | ORAL | Status: DC | PRN

## 2017-03-22 MED ORDER — POTASSIUM CHLORIDE 10 MEQ/100ML IV SOLN
10.0000 meq | INTRAVENOUS | Status: AC
  Administered 2017-03-22 (×3): 10 meq via INTRAVENOUS
  Filled 2017-03-22 (×3): qty 100

## 2017-03-22 MED ORDER — BISACODYL 5 MG PO TBEC: 5.0000 mg | DELAYED_RELEASE_TABLET | Freq: Every day | ORAL | Status: DC | PRN

## 2017-03-22 MED ORDER — PHENAZOPYRIDINE HCL 100 MG PO TABS
200.0000 mg | ORAL_TABLET | Freq: Three times a day (TID) | ORAL | Status: DC | PRN
Start: 2017-03-22 — End: 2017-03-22

## 2017-03-22 MED ORDER — OXYBUTYNIN CHLORIDE 5 MG PO TABS: 5.0000 mg | ORAL_TABLET | Freq: Three times a day (TID) | ORAL | 0 refills | Status: DC | PRN

## 2017-03-22 MED ORDER — ONDANSETRON HCL 4 MG PO TABS: 4.0000 mg | ORAL_TABLET | Freq: Four times a day (QID) | ORAL | Status: DC | PRN

## 2017-03-22 MED ORDER — SULFAMETHOXAZOLE-TRIMETHOPRIM 800-160 MG PO TABS
1.0000 | ORAL_TABLET | Freq: Two times a day (BID) | ORAL | Status: DC
  Filled 2017-03-22: qty 1

## 2017-03-22 MED ORDER — HYDROMORPHONE HCL 1 MG/ML IJ SOLN: 0.5000 mg | INTRAMUSCULAR | Status: DC | PRN

## 2017-03-22 MED ORDER — SULFAMETHOXAZOLE-TRIMETHOPRIM 800-160 MG PO TABS: 1.0000 | ORAL_TABLET | Freq: Two times a day (BID) | ORAL | 0 refills | Status: DC

## 2017-03-22 MED ORDER — ARIPIPRAZOLE 15 MG PO TABS
15.0000 mg | ORAL_TABLET | Freq: Every day | ORAL | Status: DC
  Filled 2017-03-22: qty 1

## 2017-03-22 MED ORDER — TAMSULOSIN HCL 0.4 MG PO CAPS
0.4000 mg | ORAL_CAPSULE | Freq: Every day | ORAL | Status: DC
  Administered 2017-03-22: 0.4 mg via ORAL
  Filled 2017-03-22: qty 1

## 2017-03-22 MED ORDER — TAMSULOSIN HCL 0.4 MG PO CAPS: 0.4000 mg | ORAL_CAPSULE | Freq: Every day | ORAL | 0 refills | Status: DC

## 2017-03-22 MED ORDER — MORPHINE SULFATE (PF) 2 MG/ML IV SOLN: 1.0000 mg | INTRAVENOUS | Status: DC | PRN

## 2017-03-22 MED ORDER — ONDANSETRON HCL 4 MG/2ML IJ SOLN: 4.0000 mg | Freq: Four times a day (QID) | INTRAMUSCULAR | Status: DC | PRN

## 2017-03-22 MED ORDER — ALBUTEROL SULFATE (2.5 MG/3ML) 0.083% IN NEBU: 2.5000 mg | INHALATION_SOLUTION | Freq: Four times a day (QID) | RESPIRATORY_TRACT | Status: DC | PRN

## 2017-03-22 MED ORDER — IPRATROPIUM BROMIDE 0.02 % IN SOLN: 0.5000 mg | Freq: Four times a day (QID) | RESPIRATORY_TRACT | Status: DC | PRN

## 2017-03-22 MED ORDER — OXYBUTYNIN CHLORIDE 5 MG PO TABS: 5.0000 mg | ORAL_TABLET | Freq: Three times a day (TID) | ORAL | Status: DC | PRN

## 2017-03-22 MED ORDER — OXYCODONE HCL 5 MG PO TABS: 5.0000 mg | ORAL_TABLET | ORAL | Status: DC | PRN

## 2017-03-22 MED ORDER — MAGNESIUM CITRATE PO SOLN
1.0000 | Freq: Once | ORAL | Status: DC | PRN
  Filled 2017-03-22: qty 296

## 2017-03-22 MED ORDER — ACETAMINOPHEN 325 MG PO TABS: 650.0000 mg | ORAL_TABLET | Freq: Four times a day (QID) | ORAL | Status: DC | PRN

## 2017-03-22 MED ORDER — HYDROMORPHONE BOLUS VIA INFUSION: 0.5000 mg | INTRAVENOUS | Status: DC | PRN

## 2017-03-22 MED ORDER — ACETAMINOPHEN 650 MG RE SUPP: 650.0000 mg | Freq: Four times a day (QID) | RECTAL | Status: DC | PRN

## 2017-03-22 MED ORDER — ONDANSETRON HCL 4 MG PO TABS: 4.0000 mg | ORAL_TABLET | Freq: Three times a day (TID) | ORAL | Status: DC | PRN

## 2017-03-22 MED ORDER — DEXTROSE 5 % IV SOLN
2.0000 g | INTRAVENOUS | Status: DC
  Filled 2017-03-22: qty 2

## 2017-03-22 MED ORDER — SODIUM CHLORIDE 0.9 % IV SOLN
INTRAVENOUS | Status: DC
  Administered 2017-03-22: 02:00:00 via INTRAVENOUS

## 2017-03-22 NOTE — Progress Notes (Signed)
Discharge order received. Patient is alert and oriented. Vital signs stable . No signs of acute distress. Discharge instructions given. Patient verbalized understanding. No other issues noted at this time.   

## 2017-03-22 NOTE — Progress Notes (Signed)
Urology Consult Follow Up  Subjective: Patient up in room, ambulating. Reports pain has improved dramatically, some flank pain with urinating. Otherwise feeling better.  Anti-infectives: Anti-infectives    Start     Dose/Rate Route Frequency Ordered Stop   03/22/17 1000  cefTRIAXone (ROCEPHIN) 2 g in dextrose 5 % 50 mL IVPB     2 g 100 mL/hr over 30 Minutes Intravenous Every 24 hours 03/22/17 0140     03/21/17 2200  cefTRIAXone (ROCEPHIN) 1 g in dextrose 5 % 50 mL IVPB  Status:  Discontinued     1 g 100 mL/hr over 30 Minutes Intravenous  Once 03/21/17 2147 03/21/17 2223   03/21/17 2030  cefTRIAXone (ROCEPHIN) 1 g in dextrose 5 % 50 mL IVPB     1 g 100 mL/hr over 30 Minutes Intravenous  Once 03/21/17 2019 03/21/17 2211      Current Facility-Administered Medications  Medication Dose Route Frequency Provider Last Rate Last Dose  . 0.9 %  sodium chloride infusion   Intravenous Continuous Hugelmeyer, Alexis, DO 150 mL/hr at 03/22/17 0201    . acetaminophen (TYLENOL) tablet 650 mg  650 mg Oral Q6H PRN Hugelmeyer, Alexis, DO       Or  . acetaminophen (TYLENOL) suppository 650 mg  650 mg Rectal Q6H PRN Hugelmeyer, Alexis, DO      . albuterol (PROVENTIL) (2.5 MG/3ML) 0.083% nebulizer solution 2.5 mg  2.5 mg Nebulization Q6H PRN Hugelmeyer, Alexis, DO      . ARIPiprazole (ABILIFY) tablet 15 mg  15 mg Oral Daily Hugelmeyer, Alexis, DO      . bisacodyl (DULCOLAX) EC tablet 5 mg  5 mg Oral Daily PRN Hugelmeyer, Alexis, DO      . cefTRIAXone (ROCEPHIN) 2 g in dextrose 5 % 50 mL IVPB  2 g Intravenous Q24H Hugelmeyer, Alexis, DO      . ipratropium (ATROVENT) nebulizer solution 0.5 mg  0.5 mg Nebulization Q6H PRN Hugelmeyer, Alexis, DO      . magnesium citrate solution 1 Bottle  1 Bottle Oral Once PRN Hugelmeyer, Alexis, DO      . morphine 2 MG/ML injection 1 mg  1 mg Intravenous Q4H PRN Hugelmeyer, Alexis, DO      . ondansetron (ZOFRAN) tablet 4 mg  4 mg Oral Q6H PRN Hugelmeyer, Alexis, DO       Or   . ondansetron (ZOFRAN) injection 4 mg  4 mg Intravenous Q6H PRN Hugelmeyer, Alexis, DO      . oxyCODONE-acetaminophen (PERCOCET/ROXICET) 5-325 MG per tablet 1 tablet  1 tablet Oral Q6H PRN Bjorn Pippin, MD      . phenazopyridine (PYRIDIUM) tablet 200 mg  200 mg Oral TID WC PRN Bjorn Pippin, MD      . senna-docusate (Senokot-S) tablet 1 tablet  1 tablet Oral QHS PRN Hugelmeyer, Alexis, DO         Objective: Vital signs in last 24 hours: Temp:  [97.9 F (36.6 C)-100.7 F (38.2 C)] 97.9 F (36.6 C) (06/12 0447) Pulse Rate:  [84-112] 84 (06/12 0447) Resp:  [13-20] 17 (06/12 0447) BP: (90-145)/(54-91) 90/54 (06/12 0447) SpO2:  [97 %-100 %] 98 % (06/12 0447) Weight:  [178 lb (80.7 kg)] 178 lb (80.7 kg) (06/11 1735)  Intake/Output from previous day: 06/11 0701 - 06/12 0700 In: 1550 [I.V.:500; IV Piggyback:1050] Out: 300 [Urine:300] Intake/Output this shift: No intake/output data recorded.   Physical Exam  Constitutional: She is oriented to person, place, and time and well-developed, well-nourished, and in no distress.  Cardiovascular: Normal rate and regular rhythm.   Pulmonary/Chest: Breath sounds normal. No respiratory distress.  Abdominal: Soft.  Neurological: She is alert and oriented to person, place, and time.  Skin: Skin is warm and dry.  Psychiatric: Affect normal.  Vitals reviewed.   Lab Results:   Recent Labs  03/21/17 1741 03/22/17 0420  WBC 15.1* 17.0*  HGB 13.7 12.7  HCT 39.5 37.1  PLT 213 202   BMET  Recent Labs  03/21/17 1741 03/22/17 0420  NA 134* 138  K 3.4* 4.1  CL 101 107  CO2 23 25  GLUCOSE 116* 137*  BUN 17 14  CREATININE 1.04* 0.86  CALCIUM 8.9 8.2*    Studies/Results: Ct Renal Stone Study  Result Date: 03/21/2017 CLINICAL DATA:  Right flank pain 4 days.  Fever. EXAM: CT ABDOMEN AND PELVIS WITHOUT CONTRAST TECHNIQUE: Multidetector CT imaging of the abdomen and pelvis was performed following the standard protocol without IV contrast.  COMPARISON:  None. FINDINGS: Lower chest: Within normal. Hepatobiliary: Normal. Pancreas: Normal. Spleen: Normal. Adrenals/Urinary Tract: Adrenal glands are normal. Kidneys normal in size. No left renal stones. There are 2 small right renal stones over the mid to lower pole of the collecting system. There is a 1.3 cm stone over the right renal pelvis near the UPJ causing low-grade obstruction. The left ureter and remainder of the right ureter are within normal. Stomach/Bowel: Stomach and small bowel are within normal. Appendix is normal. Colon is normal. Focal narrowing of the distal transverse colon likely due to spasm/peristalsis. Vascular/Lymphatic: Within normal. Reproductive: Within normal. Other: No significant free fluid. Musculoskeletal: Within normal. IMPRESSION: Right-sided nephrolithiasis. 1.3 cm stone over the right renal pelvis near the UPJ causing low-grade obstruction. Electronically Signed   By: Elberta Fortisaniel  Boyle M.D.   On: 03/21/2017 21:06   Urine and blood cultures pending- blood culture no growth to date  Assessment: s/p Procedure(s): CYSTOSCOPY WITH STENT PLACEMENT  Plan: - Clinically improved, appears well this morning, okay for discharge with oral antibiotics 7 days ( bactrim ds bid) - We discussed various treatment options including ESWL vs. ureteroscopy, laser lithotripsy, and stent exchange. We discussed the risks and benefits of both including bleeding, infection, damage to surrounding structures, efficacy with need for possible further intervention, and need for temporary ureteral stent. -She would like to proceed with ureteroscopy as an outpatient, will be arranged in approximately 10-14 days, will need repeat urine culture prior to surgery -Please discharge with pain medicine, Flomax 400,000 g daily, oxybutynin 5 mg 3 times a day when necessary  Urology will sign off, please page with questions or concerns    LOS: 1 day    Kara [name] 03/22/2017

## 2017-03-22 NOTE — Progress Notes (Signed)
Pharmacy Antibiotic Note  Kara [name] is a 36 y.o. female admitted on 03/21/2017 with UTI.  Pharmacy has been consulted for ceftriaxone dosing.  Plan: Ceftriaxone 2 grams q 24 hours ordered.  Height: 5\' 3"  (160 cm) Weight: 178 lb (80.7 kg) IBW/kg (Calculated) : 52.4  Temp (24hrs), Avg:99 F (37.2 C), Min:98 F (36.7 C), Max:100.7 F (38.2 C)   Recent Labs Lab 03/21/17 1741 03/21/17 2207 03/21/17 2311  WBC 15.1*  --   --   CREATININE 1.04*  --   --   LATICACIDVEN  --  0.7 0.9    Estimated Creatinine Clearance: 76.6 mL/min (A) (by C-G formula based on SCr of 1.04 mg/dL (H)).    Allergies  Allergen Reactions  . Latex Rash    Antimicrobials this admission: Ceftriaxone 6/11  >>    >>   Dose adjustments this admission:   Microbiology results: 6/11 BCx: pending 6/11 UCx: pending       6/11 UA: LE(+) NO2(-) WBC TNTC  Thank you for allowing pharmacy to be a part of this patient's care.  Hyden Soley S 03/22/2017 1:43 AM

## 2017-03-22 NOTE — Op Note (Signed)
NAMAnder Slade:  UHLER [name], Kara            ACCOUNT NO.:  [id number]  MEDICAL RECORD NO.:  [id number]  LOCATION:  [facility]                         FACILITY:  [facility]  PHYSICIAN:  Excell SeltzerJohn J. Annabell HowellsWrenn, M.D.    DATE OF BIRTH:  [birth year]  DATE OF PROCEDURE:  03/21/2017 DATE OF DISCHARGE:                              OPERATIVE REPORT   The patient of Dr. Kirstie MirzaShaywitz.  PROCEDURE PERFORMED:  Cystoscopy with insertion of right double-J stent.  SURGEON:  Excell SeltzerJohn J. Annabell HowellsWrenn, M.D.  PREOPERATIVE DIAGNOSIS:  A 1.3 cm right ureteropelvic junction stone with sepsis.  POSTOPERATIVE DIAGNOSIS:  A 1.3 cm right ureteropelvic junction stone with sepsis with pyonephrosis.  SURGEON:  Dr. Bjorn PippinJohn Armin Yerger.  ANESTHESIA:  General.  BLOOD LOSS:  None.  DRAINS:  A 6-French x 26 cm right double-J stent.  SPECIMEN:  Renal pelvic urine for culture.  COMPLICATIONS:  None.  INDICATIONS:  [name] is a 36 year old white female with a history of UTIs, who had the onset of 4 days ago dysuria.  She again had a flank pain over the weekend with nausea and low-grade fevers, came to the emergency room where CT revealed a 1.3 cm right UPJ stone with some obstruction.  She had findings consistent with sepsis and was felt to require urgent stenting.  FINDINGS AND DESCRIPTION OF PROCEDURE:  She was given Rocephin in the ER.  She was taken to the operating room, where general anesthetic was induced.  She was placed in a lithotomy position.  Her perineum and genitalia were prepped with Betadine scrubbing solution.  She was draped in a usual sterile fashion.  Cystoscopy was performed using a 23-French scope and the 30-degree lens examination revealed a normal urethra.  The bladder wall and patchy erythema consistent with acute cystitis.  The ureteral orifices were in the normal anatomic position.  No tumors or stones were noted.  The right ureteral orifice was cannulated with a 5-French open-end catheter, which was passed to just below the  stone, which was easily seen on fluoroscopy.  A Sensor guidewire was then passed by the stone into the renal collecting system and the open-end catheter was advanced over the wire.  The wire was removed and the catheter was observed to produce an efflux of bloody purulent urine.  Specimen was collected for culture and sent to the lab.  The guidewire was then reinserted through the open-end catheter, which was then removed.  A 6-French 26 cm Contour double-J stent without tether was passed to the kidney under fluoroscopic guidance.  Once in good position, the wire was removed leaving a good coil in the kidney and a good coil in the bladder.  Purulent urine was noted efflux from the stent ports.  The bladder was then drained.  The cystoscope was removed.  The patient was taken down from lithotomy position.  Her anesthetic was reversed.  She was moved to recovery room in stable condition.  There were no complications.     Excell SeltzerJohn J. Annabell HowellsWrenn, M.D.     JJW/MEDQ  D:  03/21/2017  T:  03/22/2017  Job:  [id number]

## 2017-03-22 NOTE — Anesthesia Postprocedure Evaluation (Signed)
Anesthesia Post Note  Patient: Kara [name]  Procedure(s) Performed: Procedure(s) (LRB): CYSTOSCOPY WITH STENT PLACEMENT (Right)  Patient location during evaluation: PACU Anesthesia Type: General Level of consciousness: awake and alert Pain management: pain level controlled Vital Signs Assessment: post-procedure vital signs reviewed and stable Respiratory status: spontaneous breathing, nonlabored ventilation, respiratory function stable and patient connected to nasal cannula oxygen Cardiovascular status: blood pressure returned to baseline and stable Postop Assessment: no signs of nausea or vomiting Anesthetic complications: no     Last Vitals:  Vitals:   03/22/17 0000 03/22/17 0447  BP: (!) 108/59 (!) 90/54  Pulse: (!) 101 84  Resp: 18 17  Temp: 36.7 C 36.6 C    Last Pain:  Vitals:   03/22/17 0612  TempSrc:   PainSc: 0-No pain                 Arnoldo Hildreth S

## 2017-03-22 NOTE — Discharge Summary (Signed)
[facility] - [place] at [facility]   PATIENT NAME: Kara [name]    MR#:  [id number]  DATE OF BIRTH:  05-19-1981  DATE OF ADMISSION:  03/21/2017 ADMITTING PHYSICIAN: Tonye Royalty, DO  DATE OF DISCHARGE: 03/22/17  PRIMARY CARE PHYSICIAN: Waldon Merl, PA-C    ADMISSION DIAGNOSIS:  Kidney stone [N20.0] Pyelonephritis [N12] Right flank pain [R10.9] Sepsis, due to unspecified organism (HCC) [A41.9]  DISCHARGE DIAGNOSIS:  Acute right ureterolithiasis with hydronephrosis s/p Cystoscopy with insertion of right double-J stent. sepsis due to ureteral stone/pyelonephritis  SECONDARY DIAGNOSIS:   Past Medical History:  Diagnosis Date  . Bipolar disorder (HCC)   . Depression   . GERD (gastroesophageal reflux disease)   . Hair loss   . Irregular menstrual cycle   . Migraines   . Obesity   . Palpitation     HOSPITAL COURSE:  36 y.o. female with a history of Bipolar disorder, depression, GERD now being admitted with:  #. Sepsis secondary to pyelonephritis due to right ureteral stone s/p  s/p Cystoscopy with insertion of right double-J stent.  - IV antibiotics: Rocephin---change to po bactrim -Antipyretics, antiemetics as needed -recieved IV fluid for hydration -  blood culture negative  -urine cultures pending -seen by dr brandon---pt overall stable for d/c on po bactrim, oxybutinin and flomax. F/u in 3-5 days  #. Obstructive uropathy -Urology consult appreciated. S/p cystoscopy with right ureteral stent placement. -Pain control  #. Hyponatremia and hypokalemia, mild - repleted  #. History of bipolar disorder/depression -Continue Abilify  Overall stable for d/c Pt and family agreeable  CONSULTS OBTAINED:  Treatment Team:  Bjorn Pippin, MD  DRUG ALLERGIES:   Allergies  Allergen Reactions  . Latex Rash    DISCHARGE MEDICATIONS:   Current Discharge Medication List    START taking these medications   Details   oxybutynin (DITROPAN) 5 MG tablet Take 1 tablet (5 mg total) by mouth every 8 (eight) hours as needed for bladder spasms. Qty: 20 tablet, Refills: 0    sulfamethoxazole-trimethoprim (BACTRIM DS,SEPTRA DS) 800-160 MG tablet Take 1 tablet by mouth every 12 (twelve) hours. Qty: 20 tablet, Refills: 0    tamsulosin (FLOMAX) 0.4 MG CAPS capsule Take 1 capsule (0.4 mg total) by mouth daily. Qty: 30 capsule, Refills: 0      CONTINUE these medications which have NOT CHANGED   Details  ARIPiprazole (ABILIFY) 15 MG tablet Take 1 tablet (15 mg total) by mouth daily. Qty: 30 tablet, Refills: 5        If you experience worsening of your admission symptoms, develop shortness of breath, life threatening emergency, suicidal or homicidal thoughts you must seek medical attention immediately by calling 911 or calling your MD immediately  if symptoms less severe.  You Must read complete instructions/literature along with all the possible adverse reactions/side effects for all the Medicines you take and that have been prescribed to you. Take any new Medicines after you have completely understood and accept all the possible adverse reactions/side effects.   Please note  You were cared for by a hospitalist during your hospital stay. If you have any questions about your discharge medications or the care you received while you were in the hospital after you are discharged, you can call the unit and asked to speak with the hospitalist on call if the hospitalist that took care of you is not available. Once you are discharged, your primary care physician will handle any further medical issues. Please note that  NO REFILLS for any discharge medications will be authorized once you are discharged, as it is imperative that you return to your primary care physician (or establish a relationship with a primary care physician if you do not have one) for your aftercare needs so that they can reassess your need for medications  and monitor your lab values. Today   SUBJECTIVE   Doing well  VITAL SIGNS:  Blood pressure (!) 90/54, pulse 84, temperature 97.9 F (36.6 C), temperature source Oral, resp. rate 17, height 5\' 3"  (1.6 m), weight 80.7 kg (178 lb), last menstrual period 03/19/2017, SpO2 98 %.  I/O:   Intake/Output Summary (Last 24 hours) at 03/22/17 0909 Last data filed at 03/22/17 0612  Gross per 24 hour  Intake             1550 ml  Output              300 ml  Net             1250 ml    PHYSICAL EXAMINATION:  GENERAL:  36 y.o.-year-old patient lying in the bed with no acute distress.  EYES: Pupils equal, round, reactive to light and accommodation. No scleral icterus. Extraocular muscles intact.  HEENT: Head atraumatic, normocephalic. Oropharynx and nasopharynx clear.  NECK:  Supple, no jugular venous distention. No thyroid enlargement, no tenderness.  LUNGS: Normal breath sounds bilaterally, no wheezing, rales,rhonchi or crepitation. No use of accessory muscles of respiration.  CARDIOVASCULAR: S1, S2 normal. No murmurs, rubs, or gallops.  ABDOMEN: Soft, non-tender, non-distended. Bowel sounds present. No organomegaly or mass.  EXTREMITIES: No pedal edema, cyanosis, or clubbing.  NEUROLOGIC: Cranial nerves II through XII are intact. Muscle strength 5/5 in all extremities. Sensation intact. Gait not checked.  PSYCHIATRIC: The patient is alert and oriented x 3.  SKIN: No obvious rash, lesion, or ulcer.   DATA REVIEW:   CBC   Recent Labs Lab 03/22/17 0420  WBC 17.0*  HGB 12.7  HCT 37.1  PLT 202    Chemistries   Recent Labs Lab 03/22/17 0420  NA 138  K 4.1  CL 107  CO2 25  GLUCOSE 137*  BUN 14  CREATININE 0.86  CALCIUM 8.2*  AST 18  ALT 16  ALKPHOS 43  BILITOT 0.9    Microbiology Results   Recent Results (from the past 240 hour(s))  Blood Culture (routine x 2)     Status: None (Preliminary result)   Collection Time: 03/21/17 10:07 PM  Result Value Ref Range Status    Specimen Description BLOOD LT ARM  Final   Special Requests   Final    BOTTLES DRAWN AEROBIC AND ANAEROBIC Blood Culture results may not be optimal due to an excessive volume of blood received in culture bottles   Culture NO GROWTH < 12 HOURS  Final   Report Status PENDING  Incomplete  Blood Culture (routine x 2)     Status: None (Preliminary result)   Collection Time: 03/21/17 11:10 PM  Result Value Ref Range Status   Specimen Description BLOOD RT HAND  Final   Special Requests   Final    BOTTLES DRAWN AEROBIC AND ANAEROBIC Blood Culture adequate volume   Culture NO GROWTH < 12 HOURS  Final   Report Status PENDING  Incomplete    RADIOLOGY:  Ct Renal Stone Study  Result Date: 03/21/2017 CLINICAL DATA:  Right flank pain 4 days.  Fever. EXAM: CT ABDOMEN AND PELVIS WITHOUT CONTRAST TECHNIQUE: Multidetector CT imaging  of the abdomen and pelvis was performed following the standard protocol without IV contrast. COMPARISON:  None. FINDINGS: Lower chest: Within normal. Hepatobiliary: Normal. Pancreas: Normal. Spleen: Normal. Adrenals/Urinary Tract: Adrenal glands are normal. Kidneys normal in size. No left renal stones. There are 2 small right renal stones over the mid to lower pole of the collecting system. There is a 1.3 cm stone over the right renal pelvis near the UPJ causing low-grade obstruction. The left ureter and remainder of the right ureter are within normal. Stomach/Bowel: Stomach and small bowel are within normal. Appendix is normal. Colon is normal. Focal narrowing of the distal transverse colon likely due to spasm/peristalsis. Vascular/Lymphatic: Within normal. Reproductive: Within normal. Other: No significant free fluid. Musculoskeletal: Within normal. IMPRESSION: Right-sided nephrolithiasis. 1.3 cm stone over the right renal pelvis near the UPJ causing low-grade obstruction. Electronically Signed   By: Elberta Fortisaniel  Boyle M.D.   On: 03/21/2017 21:06     Management plans discussed with the  patient, family and they are in agreement.  CODE STATUS:     Code Status Orders        Start     Ordered   03/22/17 0115  Full code  Continuous     03/22/17 0115    Code Status History    Date Active Date Inactive Code Status Order ID Comments User Context   This patient has a current code status but no historical code status.      TOTAL TIME TAKING CARE OF THIS PATIENT: *40* minutes.    Reilley Latorre M.D on 03/22/2017 at 9:09 AM  Between 7am to 6pm - Pager - [phone] After 6pm go to [email or web address] - Social research officer, governmentpassword [email or web address]  [facility]  Office  [phone]  CC: Primary care physician; Waldon MerlMartin, William C, PA-C

## 2017-03-23 ENCOUNTER — Telehealth: Payer: Self-pay | Admitting: Radiology

## 2017-03-23 ENCOUNTER — Other Ambulatory Visit: Payer: Self-pay | Admitting: Radiology

## 2017-03-23 DIAGNOSIS — N200 Calculus of kidney: Secondary | ICD-10-CM

## 2017-03-23 LAB — URINE CULTURE

## 2017-03-23 LAB — HIV ANTIBODY (ROUTINE TESTING W REFLEX): HIV SCREEN 4TH GENERATION: NONREACTIVE

## 2017-03-23 NOTE — Telephone Encounter (Signed)
-----   Message from Vanna ScotlandAshley Brandon, MD sent at 03/22/2017  8:54 AM EDT ----- Regarding: URS in 2 weeks Please go ahead and get this patient scheduled for ureteroscopy in 2 weeks. She'll need, and near the end of next week for repeat urine culture prior to surgery. She is otherwise young and healthy.  Vanna ScotlandAshley Brandon, MD

## 2017-03-23 NOTE — Telephone Encounter (Signed)
Spoke with pt regarding surgery date. Pt in agreement with 04/05/17. Advised pt to RTC for ucx on 03/30/17. Appt made. Pt aware. Will schedule surgery & pre-admit testing phone interview & call pt to confirm.

## 2017-03-23 NOTE — Telephone Encounter (Signed)
LMOM. Need to discuss surgery with Dr Apolinar JunesBrandon.

## 2017-03-24 LAB — URINE CULTURE: Culture: >=100000 — AB

## 2017-03-24 NOTE — Telephone Encounter (Signed)
LMOM. Need to confirm surgery information.

## 2017-03-24 NOTE — Telephone Encounter (Signed)
Notified pt of ureteroscopy scheduled with Dr Apolinar JunesBrandon on 04/05/17, pre-admit testing phone interview on 03/25/17 between 9am-1pm & to call day prior to surgery for arrival time to SDS. Questions answered to pt's satisfaction. No further questions at this time. Pt voices understanding.

## 2017-03-25 ENCOUNTER — Encounter
Admission: RE | Admit: 2017-03-25 | Discharge: 2017-03-25 | Disposition: A | Discharge status: Home / Self Care | Payer: BLUE CROSS/BLUE SHIELD | Source: Ambulatory Visit | Attending: Urology | Admitting: Urology

## 2017-03-25 HISTORY — DX: Personal history of urinary calculi: Z87.442

## 2017-03-25 NOTE — Patient Instructions (Signed)
  Your procedure is scheduled JY:NWGNFAOon:Tuesday April 05, 2017. Report to Same Day Surgery. To find out your arrival time please call [phone] between 1PM - 3PM on Monday April 04, 2018.  Remember: Instructions that are not followed completely may result in serious medical risk, up to and including death, or upon the discretion of your surgeon and anesthesiologist your surgery may need to be rescheduled.    _x___ 1. Do not eat food or drink liquids after midnight. No gum chewing or hard candies.     ____ 2. No Alcohol for 24 hours before or after surgery.   ____ 3. Bring all medications with you on the day of surgery if instructed.    __x__ 4. Notify your doctor if there is any change in your medical condition     (cold, fever, infections).    __x___ 5. No smoking 24 hours prior to surgery.     Do not wear jewelry, make-up, hairpins, clips or nail polish.  Do not wear lotions, powders, or perfumes.   Do not shave 48 hours prior to surgery. Men may shave face and neck.  Do not bring valuables to the hospital.    [facility] is not responsible for any belongings or valuables.               Contacts, dentures or bridgework may not be worn into surgery.  Leave your suitcase in the car. After surgery it may be brought to your room.  For patients admitted to the hospital, discharge time is determined by your treatment team.   Patients discharged the day of surgery will not be allowed to drive home.    Please read over the following fact sheets that you were given:   [facility] Preparing [facility]  __x__ Take these medicines the morning of surgery with A SIP OF WATER:    1. ARIPiprazole (ABILIFY)  2. sulfamethoxazole-trimethoprim (BACTRIM DS,SEPTRA DS)   ____ Fleet Enema (as directed)   ____ Use CHG Soap as directed on instruction sheet  ____ Use inhalers on the day of surgery and bring to hospital day of surgery  ____ Stop metformin 2 days prior to surgery    ____ Take 1/2 of  usual insulin dose the night before surgery and none on the morning of surgery.   ____ Stop Coumadin/Plavix/aspirin on does not apply.  __x__ Stop Anti-inflammatories such as Advil, Aleve, Ibuprofen, Motrin, Naproxen, Naprosyn, Goodies powders or aspirin  products. OK to take Tylenol.   ____ Stop supplements until after surgery.    ____ Bring C-Pap to the hospital.

## 2017-03-30 ENCOUNTER — Other Ambulatory Visit: Payer: Self-pay | Admitting: Radiology

## 2017-03-30 ENCOUNTER — Other Ambulatory Visit
Admission: RE | Admit: 2017-03-30 | Discharge: 2017-03-30 | Disposition: A | Discharge status: Home / Self Care | Payer: BLUE CROSS/BLUE SHIELD | Source: Ambulatory Visit | Attending: Urology | Admitting: Urology

## 2017-03-30 ENCOUNTER — Other Ambulatory Visit: Payer: BLUE CROSS/BLUE SHIELD

## 2017-03-30 DIAGNOSIS — N201 Calculus of ureter: Secondary | ICD-10-CM | POA: Diagnosis present

## 2017-03-30 LAB — CULTURE, BLOOD (ROUTINE X 2)
CULTURE: NO GROWTH
Culture: NO GROWTH
Special Requests: ADEQUATE

## 2017-03-31 LAB — URINE CULTURE: Culture: NO GROWTH

## 2017-04-02 ENCOUNTER — Other Ambulatory Visit: Payer: Self-pay | Admitting: Physician Assistant

## 2017-04-04 MED ORDER — SODIUM CHLORIDE 0.9 % IV SOLN
1.0000 g | INTRAVENOUS | Status: AC
  Administered 2017-04-05: 1 g via INTRAVENOUS

## 2017-04-05 ENCOUNTER — Ambulatory Visit: Payer: BLUE CROSS/BLUE SHIELD | Admitting: Anesthesiology

## 2017-04-05 ENCOUNTER — Encounter: Payer: Self-pay | Admitting: *Deleted

## 2017-04-05 ENCOUNTER — Ambulatory Visit
Admission: RE | Admit: 2017-04-05 | Discharge: 2017-04-05 | Disposition: A | Discharge status: Home / Self Care | Payer: BLUE CROSS/BLUE SHIELD | Source: Ambulatory Visit | Attending: Urology | Admitting: Urology

## 2017-04-05 ENCOUNTER — Encounter
Admission: RE | Disposition: A | Discharge status: Home / Self Care | Payer: Self-pay | Source: Ambulatory Visit | Attending: Urology

## 2017-04-05 DIAGNOSIS — N200 Calculus of kidney: Secondary | ICD-10-CM | POA: Diagnosis present

## 2017-04-05 DIAGNOSIS — K219 Gastro-esophageal reflux disease without esophagitis: Secondary | ICD-10-CM | POA: Insufficient documentation

## 2017-04-05 DIAGNOSIS — F17210 Nicotine dependence, cigarettes, uncomplicated: Secondary | ICD-10-CM | POA: Insufficient documentation

## 2017-04-05 DIAGNOSIS — N202 Calculus of kidney with calculus of ureter: Secondary | ICD-10-CM | POA: Diagnosis not present

## 2017-04-05 DIAGNOSIS — F319 Bipolar disorder, unspecified: Secondary | ICD-10-CM | POA: Insufficient documentation

## 2017-04-05 HISTORY — PX: URETEROSCOPY WITH HOLMIUM LASER LITHOTRIPSY: SHX6645

## 2017-04-05 HISTORY — PX: CYSTOSCOPY W/ URETERAL STENT PLACEMENT: SHX1429

## 2017-04-05 LAB — POCT PREGNANCY, URINE: PREG TEST UR: NEGATIVE

## 2017-04-05 SURGERY — URETEROSCOPY, WITH LITHOTRIPSY USING HOLMIUM LASER
Anesthesia: General | Site: Ureter | Laterality: Right | Wound class: Clean Contaminated

## 2017-04-05 MED ORDER — FAMOTIDINE 20 MG PO TABS: 20.0000 mg | ORAL_TABLET | Freq: Once | ORAL | Status: DC

## 2017-04-05 MED ORDER — PROPOFOL 10 MG/ML IV BOLUS
INTRAVENOUS | Status: DC | PRN
  Administered 2017-04-05: 200 mg via INTRAVENOUS

## 2017-04-05 MED ORDER — DEXAMETHASONE SODIUM PHOSPHATE 10 MG/ML IJ SOLN
INTRAMUSCULAR | Status: DC | PRN
  Administered 2017-04-05: 10 mg via INTRAVENOUS

## 2017-04-05 MED ORDER — NEOSTIGMINE METHYLSULFATE 10 MG/10ML IV SOLN
INTRAVENOUS | Status: DC | PRN
  Administered 2017-04-05: 5 mg via INTRAVENOUS

## 2017-04-05 MED ORDER — GENTAMICIN SULFATE 40 MG/ML IJ SOLN
320.0000 mg | Freq: Once | INTRAVENOUS | Status: AC
  Administered 2017-04-05: 320 mg via INTRAVENOUS
  Filled 2017-04-05: qty 8

## 2017-04-05 MED ORDER — AMPICILLIN SODIUM 1 G IJ SOLR
INTRAMUSCULAR | Status: AC
  Filled 2017-04-05: qty 1000

## 2017-04-05 MED ORDER — TAMSULOSIN HCL 0.4 MG PO CAPS: 0.4000 mg | ORAL_CAPSULE | Freq: Every day | ORAL | 0 refills | Status: DC

## 2017-04-05 MED ORDER — ONDANSETRON HCL 4 MG/2ML IJ SOLN
INTRAMUSCULAR | Status: AC
  Filled 2017-04-05: qty 2

## 2017-04-05 MED ORDER — NEOSTIGMINE METHYLSULFATE 10 MG/10ML IV SOLN
INTRAVENOUS | Status: AC
  Filled 2017-04-05: qty 1

## 2017-04-05 MED ORDER — MIDAZOLAM HCL 2 MG/2ML IJ SOLN
INTRAMUSCULAR | Status: DC | PRN
  Administered 2017-04-05: 2 mg via INTRAVENOUS

## 2017-04-05 MED ORDER — GLYCOPYRROLATE 0.2 MG/ML IJ SOLN
INTRAMUSCULAR | Status: AC
  Filled 2017-04-05: qty 1

## 2017-04-05 MED ORDER — PROPOFOL 10 MG/ML IV BOLUS
INTRAVENOUS | Status: AC
  Filled 2017-04-05: qty 20

## 2017-04-05 MED ORDER — FENTANYL CITRATE (PF) 100 MCG/2ML IJ SOLN
INTRAMUSCULAR | Status: AC
  Filled 2017-04-05: qty 2

## 2017-04-05 MED ORDER — ONDANSETRON HCL 4 MG/2ML IJ SOLN
INTRAMUSCULAR | Status: DC | PRN
  Administered 2017-04-05: 4 mg via INTRAVENOUS

## 2017-04-05 MED ORDER — ROCURONIUM BROMIDE 100 MG/10ML IV SOLN
INTRAVENOUS | Status: DC | PRN
  Administered 2017-04-05: 50 mg via INTRAVENOUS
  Administered 2017-04-05: 10 mg via INTRAVENOUS

## 2017-04-05 MED ORDER — FENTANYL CITRATE (PF) 100 MCG/2ML IJ SOLN
INTRAMUSCULAR | Status: DC | PRN
  Administered 2017-04-05: 100 ug via INTRAVENOUS

## 2017-04-05 MED ORDER — IOTHALAMATE MEGLUMINE 43 % IV SOLN
INTRAVENOUS | Status: DC | PRN
  Administered 2017-04-05: 25 mL

## 2017-04-05 MED ORDER — HYDROCODONE-ACETAMINOPHEN 5-325 MG PO TABS
ORAL_TABLET | ORAL | Status: AC
  Filled 2017-04-05: qty 1

## 2017-04-05 MED ORDER — MIDAZOLAM HCL 2 MG/2ML IJ SOLN
INTRAMUSCULAR | Status: AC
  Filled 2017-04-05: qty 2

## 2017-04-05 MED ORDER — LACTATED RINGERS IV SOLN
INTRAVENOUS | Status: DC
  Administered 2017-04-05: 09:00:00 via INTRAVENOUS

## 2017-04-05 MED ORDER — HYDROCODONE-ACETAMINOPHEN 5-325 MG PO TABS
1.0000 | ORAL_TABLET | Freq: Four times a day (QID) | ORAL | Status: DC | PRN
  Administered 2017-04-05: 1 via ORAL

## 2017-04-05 MED ORDER — FAMOTIDINE 20 MG PO TABS
ORAL_TABLET | ORAL | Status: AC
  Administered 2017-04-05: 20 mg via ORAL
  Filled 2017-04-05: qty 1

## 2017-04-05 MED ORDER — LIDOCAINE HCL (CARDIAC) 20 MG/ML IV SOLN
INTRAVENOUS | Status: DC | PRN
  Administered 2017-04-05: 40 mg via INTRAVENOUS

## 2017-04-05 MED ORDER — GLYCOPYRROLATE 0.2 MG/ML IJ SOLN
INTRAMUSCULAR | Status: DC | PRN
  Administered 2017-04-05: .6 mg via INTRAVENOUS

## 2017-04-05 MED ORDER — HYDROCODONE-ACETAMINOPHEN 5-325 MG PO TABS: 1.0000 | ORAL_TABLET | Freq: Four times a day (QID) | ORAL | 0 refills | Status: DC | PRN

## 2017-04-05 MED ORDER — DEXAMETHASONE SODIUM PHOSPHATE 10 MG/ML IJ SOLN
INTRAMUSCULAR | Status: AC
  Filled 2017-04-05: qty 1

## 2017-04-05 SURGICAL SUPPLY — 32 items
ADHESIVE MASTISOL STRL (MISCELLANEOUS) ×3 IMPLANT
BAG DRAIN CYSTO-URO LG1000N (MISCELLANEOUS) ×3 IMPLANT
BASKET ZERO TIP 1.9FR (BASKET) ×3 IMPLANT
CATH URETL 5X70 OPEN END (CATHETERS) ×3 IMPLANT
CNTNR SPEC 2.5X3XGRAD LEK (MISCELLANEOUS)
CONRAY 43 FOR UROLOGY 50M (MISCELLANEOUS) ×3 IMPLANT
CONT SPEC 4OZ STER OR WHT (MISCELLANEOUS)
CONTAINER SPEC 2.5X3XGRAD LEK (MISCELLANEOUS) IMPLANT
DRAPE UTILITY 15X26 TOWEL STRL (DRAPES) ×3 IMPLANT
DRSG TEGADERM 2-3/8X2-3/4 SM (GAUZE/BANDAGES/DRESSINGS) ×3 IMPLANT
FIBER LASER LITHO 273 (Laser) ×3 IMPLANT
GLOVE BIO SURGEON STRL SZ 6.5 (GLOVE) ×2 IMPLANT
GLOVE BIO SURGEONS STRL SZ 6.5 (GLOVE) ×1
GOWN STRL REUS W/ TWL LRG LVL3 (GOWN DISPOSABLE) ×2 IMPLANT
GOWN STRL REUS W/TWL LRG LVL3 (GOWN DISPOSABLE) ×4
GUIDEWIRE GREEN .038 145CM (MISCELLANEOUS) ×3 IMPLANT
INFUSOR MANOMETER BAG 3000ML (MISCELLANEOUS) ×3 IMPLANT
INTRODUCER DILATOR DOUBLE (INTRODUCER) ×3 IMPLANT
KIT RM TURNOVER CYSTO AR (KITS) ×3 IMPLANT
PACK CYSTO AR (MISCELLANEOUS) ×3 IMPLANT
SCRUB POVIDONE IODINE 4 OZ (MISCELLANEOUS) IMPLANT
SENSORWIRE 0.038 NOT ANGLED (WIRE) ×3
SET CYSTO W/LG BORE CLAMP LF (SET/KITS/TRAYS/PACK) ×3 IMPLANT
SHEATH URETERAL 12FRX35CM (MISCELLANEOUS) ×3 IMPLANT
SOL .9 NS 3000ML IRR  AL (IV SOLUTION) ×2
SOL .9 NS 3000ML IRR UROMATIC (IV SOLUTION) ×1 IMPLANT
STENT URET 6FRX24 CONTOUR (STENTS) IMPLANT
STENT URET 6FRX26 CONTOUR (STENTS) IMPLANT
SURGILUBE 2OZ TUBE FLIPTOP (MISCELLANEOUS) ×3 IMPLANT
SYRINGE IRR TOOMEY STRL 70CC (SYRINGE) ×3 IMPLANT
WATER STERILE IRR 1000ML POUR (IV SOLUTION) ×3 IMPLANT
WIRE SENSOR 0.038 NOT ANGLED (WIRE) ×1 IMPLANT

## 2017-04-05 NOTE — Anesthesia Procedure Notes (Signed)
Procedure Name: Intubation Date/Time: 04/05/2017 10:31 AM Performed by: Lorie Apley Pre-anesthesia Checklist: Patient identified, Patient being monitored, Timeout performed, Emergency Drugs available and Suction available Patient Re-evaluated:Patient Re-evaluated prior to inductionOxygen Delivery Method: Circle system utilized Preoxygenation: Pre-oxygenation with 100% oxygen Intubation Type: IV induction Ventilation: Mask ventilation without difficulty Laryngoscope Size: Mac and 3 Grade View: Grade II Tube type: Oral Tube size: 7.5 mm Number of attempts: 1 Airway Equipment and Method: Stylet Placement Confirmation: ETT inserted through vocal cords under direct vision,  positive ETCO2 and breath sounds checked- equal and bilateral Secured at: 23 cm Tube secured with: Tape Dental Injury: Teeth and Oropharynx as per pre-operative assessment

## 2017-04-05 NOTE — H&P (View-Only) (Signed)
Urology Consult Follow Up  Subjective: Patient up in room, ambulating. Reports pain has improved dramatically, some flank pain with urinating. Otherwise feeling better.  Anti-infectives: Anti-infectives    Start     Dose/Rate Route Frequency Ordered Stop   03/22/17 1000  cefTRIAXone (ROCEPHIN) 2 g in dextrose 5 % 50 mL IVPB     2 g 100 mL/hr over 30 Minutes Intravenous Every 24 hours 03/22/17 0140     03/21/17 2200  cefTRIAXone (ROCEPHIN) 1 g in dextrose 5 % 50 mL IVPB  Status:  Discontinued     1 g 100 mL/hr over 30 Minutes Intravenous  Once 03/21/17 2147 03/21/17 2223   03/21/17 2030  cefTRIAXone (ROCEPHIN) 1 g in dextrose 5 % 50 mL IVPB     1 g 100 mL/hr over 30 Minutes Intravenous  Once 03/21/17 2019 03/21/17 2211      Current Facility-Administered Medications  Medication Dose Route Frequency Provider Last Rate Last Dose  . 0.9 %  sodium chloride infusion   Intravenous Continuous Hugelmeyer, Alexis, DO 150 mL/hr at 03/22/17 0201    . acetaminophen (TYLENOL) tablet 650 mg  650 mg Oral Q6H PRN Hugelmeyer, Alexis, DO       Or  . acetaminophen (TYLENOL) suppository 650 mg  650 mg Rectal Q6H PRN Hugelmeyer, Alexis, DO      . albuterol (PROVENTIL) (2.5 MG/3ML) 0.083% nebulizer solution 2.5 mg  2.5 mg Nebulization Q6H PRN Hugelmeyer, Alexis, DO      . ARIPiprazole (ABILIFY) tablet 15 mg  15 mg Oral Daily Hugelmeyer, Alexis, DO      . bisacodyl (DULCOLAX) EC tablet 5 mg  5 mg Oral Daily PRN Hugelmeyer, Alexis, DO      . cefTRIAXone (ROCEPHIN) 2 g in dextrose 5 % 50 mL IVPB  2 g Intravenous Q24H Hugelmeyer, Alexis, DO      . ipratropium (ATROVENT) nebulizer solution 0.5 mg  0.5 mg Nebulization Q6H PRN Hugelmeyer, Alexis, DO      . magnesium citrate solution 1 Bottle  1 Bottle Oral Once PRN Hugelmeyer, Alexis, DO      . morphine 2 MG/ML injection 1 mg  1 mg Intravenous Q4H PRN Hugelmeyer, Alexis, DO      . ondansetron (ZOFRAN) tablet 4 mg  4 mg Oral Q6H PRN Hugelmeyer, Alexis, DO       Or   . ondansetron (ZOFRAN) injection 4 mg  4 mg Intravenous Q6H PRN Hugelmeyer, Alexis, DO      . oxyCODONE-acetaminophen (PERCOCET/ROXICET) 5-325 MG per tablet 1 tablet  1 tablet Oral Q6H PRN Bjorn Pippin, MD      . phenazopyridine (PYRIDIUM) tablet 200 mg  200 mg Oral TID WC PRN Bjorn Pippin, MD      . senna-docusate (Senokot-S) tablet 1 tablet  1 tablet Oral QHS PRN Hugelmeyer, Alexis, DO         Objective: Vital signs in last 24 hours: Temp:  [97.9 F (36.6 C)-100.7 F (38.2 C)] 97.9 F (36.6 C) (06/12 0447) Pulse Rate:  [84-112] 84 (06/12 0447) Resp:  [13-20] 17 (06/12 0447) BP: (90-145)/(54-91) 90/54 (06/12 0447) SpO2:  [97 %-100 %] 98 % (06/12 0447) Weight:  [178 lb (80.7 kg)] 178 lb (80.7 kg) (06/11 1735)  Intake/Output from previous day: 06/11 0701 - 06/12 0700 In: 1550 [I.V.:500; IV Piggyback:1050] Out: 300 [Urine:300] Intake/Output this shift: No intake/output data recorded.   Physical Exam  Constitutional: She is oriented to person, place, and time and well-developed, well-nourished, and in no distress.  Cardiovascular: Normal rate and regular rhythm.   Pulmonary/Chest: Breath sounds normal. No respiratory distress.  Abdominal: Soft.  Neurological: She is alert and oriented to person, place, and time.  Skin: Skin is warm and dry.  Psychiatric: Affect normal.  Vitals reviewed.   Lab Results:   Recent Labs  03/21/17 1741 03/22/17 0420  WBC 15.1* 17.0*  HGB 13.7 12.7  HCT 39.5 37.1  PLT 213 202   BMET  Recent Labs  03/21/17 1741 03/22/17 0420  NA 134* 138  K 3.4* 4.1  CL 101 107  CO2 23 25  GLUCOSE 116* 137*  BUN 17 14  CREATININE 1.04* 0.86  CALCIUM 8.9 8.2*    Studies/Results: Ct Renal Stone Study  Result Date: 03/21/2017 CLINICAL DATA:  Right flank pain 4 days.  Fever. EXAM: CT ABDOMEN AND PELVIS WITHOUT CONTRAST TECHNIQUE: Multidetector CT imaging of the abdomen and pelvis was performed following the standard protocol without IV contrast.  COMPARISON:  None. FINDINGS: Lower chest: Within normal. Hepatobiliary: Normal. Pancreas: Normal. Spleen: Normal. Adrenals/Urinary Tract: Adrenal glands are normal. Kidneys normal in size. No left renal stones. There are 2 small right renal stones over the mid to lower pole of the collecting system. There is a 1.3 cm stone over the right renal pelvis near the UPJ causing low-grade obstruction. The left ureter and remainder of the right ureter are within normal. Stomach/Bowel: Stomach and small bowel are within normal. Appendix is normal. Colon is normal. Focal narrowing of the distal transverse colon likely due to spasm/peristalsis. Vascular/Lymphatic: Within normal. Reproductive: Within normal. Other: No significant free fluid. Musculoskeletal: Within normal. IMPRESSION: Right-sided nephrolithiasis. 1.3 cm stone over the right renal pelvis near the UPJ causing low-grade obstruction. Electronically Signed   By: Elberta Fortisaniel  Boyle M.D.   On: 03/21/2017 21:06   Urine and blood cultures pending- blood culture no growth to date  Assessment: s/p Procedure(s): CYSTOSCOPY WITH STENT PLACEMENT  Plan: - Clinically improved, appears well this morning, okay for discharge with oral antibiotics 7 days ( bactrim ds bid) - We discussed various treatment options including ESWL vs. ureteroscopy, laser lithotripsy, and stent exchange. We discussed the risks and benefits of both including bleeding, infection, damage to surrounding structures, efficacy with need for possible further intervention, and need for temporary ureteral stent. -She would like to proceed with ureteroscopy as an outpatient, will be arranged in approximately 10-14 days, will need repeat urine culture prior to surgery -Please discharge with pain medicine, Flomax 400,000 g daily, oxybutynin 5 mg 3 times a day when necessary  Urology will sign off, please page with questions or concerns    LOS: 1 day    Kara [name] 03/22/2017

## 2017-04-05 NOTE — Anesthesia Post-op Follow-up Note (Cosign Needed)
Anesthesia QCDR form completed.        

## 2017-04-05 NOTE — Anesthesia Postprocedure Evaluation (Signed)
Anesthesia Post Note  Patient: Kara SladeErin [name]  Procedure(s) Performed: Procedure(s) (LRB): URETEROSCOPY WITH HOLMIUM LASER LITHOTRIPSY (Right) CYSTOSCOPY WITH STENT REPLACEMENT (Right)  Patient location during evaluation: PACU Anesthesia Type: General Level of consciousness: awake Pain management: pain level controlled Vital Signs Assessment: post-procedure vital signs reviewed and stable Respiratory status: spontaneous breathing Cardiovascular status: stable Anesthetic complications: no     Last Vitals:  Vitals:   04/05/17 1248 04/05/17 1306  BP: 115/75 119/77  Pulse: (!) 57 (!) 59  Resp: 16 16  Temp: 36.4 C     Last Pain:  Vitals:   04/05/17 1306  TempSrc: Temporal  PainSc: 6                  VAN STAVEREN,[name]

## 2017-04-05 NOTE — Discharge Instructions (Addendum)
You have a ureteral stent in place.  This is a tube that extends from your kidney to your bladder.  This may cause urinary bleeding, burning with urination, and urinary frequency.  Please call our office or present to the ED if you develop fevers >101 or pain which is not able to be controlled with oral pain medications.  You may be given either Flomax and/ or ditropan to help with bladder spasms and stent pain in addition to pain medications.    Your stent may be removed on Friday AM.  On this day, untape stent and pull gently until entire stent is removed.    [facility] [address], [address] [place], [place] [address] [phone]   AMBULATORY SURGERY  DISCHARGE INSTRUCTIONS   1) The drugs that you were given will stay in your system until tomorrow so for the next 24 hours you should not:  A) Drive an automobile B) Make any legal decisions C) Drink any alcoholic beverage   2) You may resume regular meals tomorrow.  Today it is better to start with liquids and gradually work up to solid foods.  You may eat anything you prefer, but it is better to start with liquids, then soup and crackers, and gradually work up to solid foods.   3) Please notify your doctor immediately if you have any unusual bleeding, trouble breathing, redness and pain at the surgery site, drainage, fever, or pain not relieved by medication.    4) Additional Instructions:        Please contact your physician with any problems or Same Day Surgery at [phone], Monday through Friday 6 am to 4 pm, or [place] at [facility] Main number at [phone].

## 2017-04-05 NOTE — Anesthesia Preprocedure Evaluation (Signed)
Anesthesia Evaluation  Patient identified by MRN, date of birth, ID band Patient awake    Reviewed: Allergy & Precautions, NPO status , Patient's Chart, lab work & pertinent test results  Airway Mallampati: II       Dental  (+) Teeth Intact   Pulmonary neg pulmonary ROS, Current Smoker,     + decreased breath sounds      Cardiovascular Exercise Tolerance: Good  Rhythm:Regular Rate:Normal     Neuro/Psych  Headaches, Depression Bipolar Disorder    GI/Hepatic Neg liver ROS, GERD  Medicated,  Endo/Other    Renal/GU      Musculoskeletal negative musculoskeletal ROS (+)   Abdominal   Peds negative pediatric ROS (+)  Hematology negative hematology ROS (+)   Anesthesia Other Findings   Reproductive/Obstetrics                             Anesthesia Physical Anesthesia Plan  ASA: II  Anesthesia Plan: General   Post-op Pain Management:    Induction: Intravenous  PONV Risk Score and Plan: 0 and Ondansetron  Airway Management Planned: Oral ETT  Additional Equipment:   Intra-op Plan:   Post-operative Plan: Extubation in OR  Informed Consent: I have reviewed the patients History and Physical, chart, labs and discussed the procedure including the risks, benefits and alternatives for the proposed anesthesia with the patient or authorized representative who has indicated his/her understanding and acceptance.     Plan Discussed with: CRNA  Anesthesia Plan Comments:         Anesthesia Quick Evaluation

## 2017-04-05 NOTE — Op Note (Signed)
Date of procedure: 04/05/17  Preoperative diagnosis:  1. Right UPJ/renal stones   Postoperative diagnosis:  1. Same as above   Procedure: 1. Right ureteroscopy 2. Laser lithotripsy 3. Right ureteral stent exchange 4. Right retrograde pyelogram 5. Basket extraction of stone fragments  Surgeon: Vanna Scotland, MD  Anesthesia: General  Complications: None  Intraoperative findings: 1.3 cm stone previously at the UPJ, now the renal pelvis as well as a few small nonobstructing stones treated.  EBL: Minimal  Specimens: Stone fragment  Drains: 6 x 22 French double-J ureteral stent on right, string left in place  Indication: Kara [name] is a 36 y.o. patient with an obstructing 1.3 cm right UPJ stone who previously underwent stent placement in the setting of sepsis. Her urine culture was ultimately negative. She returns today for definitive management of her stone.  After reviewing the management options for treatment, she elected to proceed with the above surgical procedure(s). We have discussed the potential benefits and risks of the procedure, side effects of the proposed treatment, the likelihood of the patient achieving the goals of the procedure, and any potential problems that might occur during the procedure or recuperation. Informed consent has been obtained.  Description of procedure:  The patient was taken to the operating room and general anesthesia was induced.  The patient was placed in the dorsal lithotomy position, prepped and draped in the usual sterile fashion, and preoperative antibiotics were administered. A preoperative time-out was performed.   A 21 French scope was advanced per urethra into the bladder. Attention was turned to the right ureteral orifice from which a ureteral stent was seen emanating. The distal coil of the stent was grasped and brought to level of the urethral meatus. This point in time, the stent was cannulated using a wire up to level of the  kidney. The wire left in place and the stent was removed. A dual lumen access sheath was used just within the distal ureter to introduce the Super Stiff wire up to the level of the kidney. As point time, a Kara [name] 12/14 [place] access sheath was advanced all the way up to the level of the proximal ureter which advanced quite easily. The inner lumen was then removed. An 8 French flexible dual channel ureteroscope was then advanced into the renal pelvis without difficulty. The large stone was encountered and pushed up into an upper pole calyx. A 273  laser fiber was then brought in and using testing settings of 0.2 J and 40 Hz, the stone was dusted into very small particles. Each of these particles was then extracted using 1.9 [place] to plus nitinol basket. A few mid and lower pole stones were also extracted using the basket. Once all stone burden was adequately cleared and no significant stones greater than the to the laser fiber in size were visualized, the scope was backed to level of the UPJ. A retrograde pyelogram was performed revealing no contrast extravasation and creating a roadmap of the kidney. Each never calyx was then directly visualized to ensure no residual significant stone burden.  The scope was then backed down the length of the ureter inspecting the ureter along the way. There is no significant residual stones within the ureter and no significant ureteral trauma was appreciated. A 6 x 22 French double-J ureteral stent was then advanced over the safety wire up to level of the renal pelvis. The wire was partially drawn until full coil was noted within the renal pelvis  Dorsum fully withdrawn and  a full course noted within the bladder. The bladder was then drained. The scope was removed and the stent string was affixed to the patient's left inner thigh using Mastisol and Tegaderm. She was then cleaned and dried, repositioned supine position, reversed from anesthesia, taken the PACU in stable condition.  There are no complications in this case.  Plan: Patient will remove her own stent on Friday. She'll follow up in 4 weeks with a renal ultrasound prior.  Vanna ScotlandAshley Berea [name], M.D.

## 2017-04-05 NOTE — Interval H&P Note (Signed)
History and Physical Interval Note:  04/05/2017 9:53 AM  Ander SladeErin Uhler-Lynch  has presented today for surgery, with the diagnosis of right nephrolithiasis  The various methods of treatment have been discussed with the patient and family. After consideration of risks, benefits and other options for treatment, the patient has consented to  Procedure(s): URETEROSCOPY WITH HOLMIUM LASER LITHOTRIPSY (Right) CYSTOSCOPY WITH STENT REPLACEMENT (Right) as a surgical intervention .  The patient's history has been reviewed, patient examined, no change in status, stable for surgery.  I have reviewed the patient's chart and labs.  Questions were answered to the patient's satisfaction.    RRR CTAB  Vanna ScotlandAshley Fusaye Wachtel

## 2017-04-05 NOTE — Transfer of Care (Signed)
Immediate Anesthesia Transfer of Care Note  Patient: Ander SladeErin Uhler-Lynch  Procedure(s) Performed: Procedure(s): URETEROSCOPY WITH HOLMIUM LASER LITHOTRIPSY (Right) CYSTOSCOPY WITH STENT REPLACEMENT (Right)  Patient Location: PACU  Anesthesia Type:General  Level of Consciousness: sedated and responds to stimulation  Airway & Oxygen Therapy: Patient Spontanous Breathing and Patient connected to face mask oxygen  Post-op Assessment: Report given to RN and Post -op Vital signs reviewed and stable  Post vital signs: Reviewed and stable  Last Vitals:  Vitals:   04/05/17 0844 04/05/17 1204  BP: 107/74 124/79  Pulse: (!) 18 71  Resp: 16 17  Temp: 36.4 C     Last Pain:  Vitals:   04/05/17 0844  TempSrc: Tympanic  PainSc: 2          Complications: No apparent anesthesia complications

## 2017-04-06 ENCOUNTER — Encounter: Payer: Self-pay | Admitting: Urology

## 2017-04-06 ENCOUNTER — Telehealth: Payer: Self-pay | Admitting: Urology

## 2017-04-06 ENCOUNTER — Telehealth: Payer: Self-pay | Admitting: Radiology

## 2017-04-06 NOTE — Telephone Encounter (Signed)
App made ° ° °Michelle  °

## 2017-04-06 NOTE — Telephone Encounter (Signed)
Kara ClientCasey [name] called stating pt's stent has come out by about 1/2 inch & she is having incontinence. Advised pt, per Dr Apolinar JunesBrandon, to remove stent & keep follow up appt with RUS prior. Kara LyonsCasey voices understanding.

## 2017-04-06 NOTE — Telephone Encounter (Signed)
-----   Message from Vanna ScotlandAshley Brandon, MD sent at 04/05/2017 12:18 PM EDT ----- Regarding: f/u 4 weeks with [facility]US prior Order placed from PACU

## 2017-04-15 LAB — STONE ANALYSIS
CA OXALATE, DIHYDRATE: 20 %
CA OXALATE, MONOHYDR.: 60 %
CA PHOS CRY STONE QL IR: 20 %
STONE WEIGHT KSTONE: 303.4 mg

## 2017-04-18 ENCOUNTER — Ambulatory Visit: Payer: BLUE CROSS/BLUE SHIELD | Admitting: Physician Assistant

## 2017-04-18 NOTE — Progress Notes (Signed)
Patient presents to clinic today for 60-monthfollow-up of Bipolar Disorder. Patient is currently on a regimen of Abilify 15 mg daily. Is taking as directed. Has noted increased somnolence and irritability since going from 5 to 15 mg daily, although she endorses more stability in mood overall. Denies depressed mood or anhedonia. Denies SI/HI.  Past Medical History:  Diagnosis Date  . Bipolar disorder ([place]   . Depression   . GERD (gastroesophageal reflux disease)   . Hair loss   . History of kidney stones   . Irregular menstrual cycle   . Migraines   . Obesity   . Palpitation     Current Outpatient Prescriptions on File Prior to Visit  Medication Sig Dispense Refill  . acetaminophen (TYLENOL) 325 MG tablet Take 650 mg by mouth every 4 (four) hours as needed for mild pain.    .Marland KitchenHYDROcodone-acetaminophen (NORCO/VICODIN) 5-325 MG tablet Take 1-2 tablets by mouth every 6 (six) hours as needed for moderate pain. 10 tablet 0   No current facility-administered medications on file prior to visit.     Allergies  Allergen Reactions  . Adhesive [Tape] Rash    Family History  Problem Relation Age of Onset  . Metabolic syndrome Mother   . Cancer Mother        Ovarian and Cervical  . COPD Father     Social History   Social History  . Marital status: Married    Spouse name: N/A  . Number of children: N/A  . Years of education: N/A   Social History Main Topics  . Smoking status: Current Every Day Smoker    Packs/day: 0.50    Years: 2.00    Types: Cigarettes  . Smokeless tobacco: Never Used  . Alcohol use No     Comment: rare  . Drug use: No  . Sexual activity: Yes    Partners: Female   Other Topics Concern  . None   Social History Narrative  . None    Review of Systems - See HPI.  All other ROS are negative.  BP 110/76   Pulse 89   Temp (!) 97.4 F (36.3 C) (Oral)   Resp 14   Ht '5\' 3"'  (1.6 m)   Wt 173 lb (78.5 kg)   SpO2 98%   BMI 30.65 kg/m   Physical  Exam  Constitutional: She is oriented to person, place, and time and well-developed, well-nourished, and in no distress.  HENT:  Head: Normocephalic and atraumatic.  Eyes: Conjunctivae are normal.  Neck: Neck supple.  Cardiovascular: Normal rate, regular rhythm, normal heart sounds and intact distal pulses.   Pulmonary/Chest: Effort normal and breath sounds normal. No respiratory distress. She has no wheezes. She has no rales. She exhibits no tenderness.  Neurological: She is alert and oriented to person, place, and time.  Skin: Skin is warm and dry. No rash noted.  Psychiatric: Affect normal.  Vitals reviewed.   Recent Results (from the past 2160 hour(s))  Basic metabolic panel     Status: Abnormal   Collection Time: 03/21/17  5:41 PM  Result Value Ref Range   Sodium 134 (L) 135 - 145 mmol/L   Potassium 3.4 (L) 3.5 - 5.1 mmol/L   Chloride 101 101 - 111 mmol/L   CO2 23 22 - 32 mmol/L   Glucose, Bld 116 (H) 65 - 99 mg/dL   BUN 17 6 - 20 mg/dL   Creatinine, Ser 1.04 (H) 0.44 - 1.00 mg/dL  Calcium 8.9 8.9 - 10.3 mg/dL   GFR calc non Af Amer >60 >60 mL/min   GFR calc Af Amer >60 >60 mL/min    Comment: (NOTE) The eGFR has been calculated using the CKD EPI equation. This calculation has not been validated in all clinical situations. eGFR's persistently <60 mL/min signify possible Chronic Kidney Disease.    Anion gap 10 5 - 15  CBC     Status: Abnormal   Collection Time: 03/21/17  5:41 PM  Result Value Ref Range   WBC 15.1 (H) 3.6 - 11.0 K/uL   RBC 4.54 3.80 - 5.20 MIL/uL   Hemoglobin 13.7 12.0 - 16.0 g/dL   HCT 39.5 35.0 - 47.0 %   MCV 87.1 80.0 - 100.0 fL   MCH 30.1 26.0 - 34.0 pg   MCHC 34.5 32.0 - 36.0 g/dL   RDW 12.9 11.5 - 14.5 %   Platelets 213 150 - 440 K/uL  Differential     Status: Abnormal   Collection Time: 03/21/17  5:41 PM  Result Value Ref Range   Neutrophils Relative % 88 %   Neutro Abs 13.2 (H) 1.4 - 6.5 K/uL   Lymphocytes Relative 4 %   Lymphs Abs 0.6  (L) 1.0 - 3.6 K/uL   Monocytes Relative 8 %   Monocytes Absolute 1.2 (H) 0.2 - 0.9 K/uL   Eosinophils Relative 0 %   Eosinophils Absolute 0.0 0 - 0.7 K/uL   Basophils Relative 0 %   Basophils Absolute 0.1 0 - 0.1 K/uL  Urinalysis, Complete w Microscopic     Status: Abnormal   Collection Time: 03/21/17  5:42 PM  Result Value Ref Range   Color, Urine YELLOW (A) YELLOW   APPearance HAZY (A) CLEAR   Specific Gravity, Urine 1.025 1.005 - 1.030   pH 5.0 5.0 - 8.0   Glucose, UA NEGATIVE NEGATIVE mg/dL   Hgb urine dipstick MODERATE (A) NEGATIVE   Bilirubin Urine NEGATIVE NEGATIVE   Ketones, ur 80 (A) NEGATIVE mg/dL   Protein, ur 100 (A) NEGATIVE mg/dL   Nitrite NEGATIVE NEGATIVE   Leukocytes, UA MODERATE (A) NEGATIVE   RBC / HPF 6-30 0 - 5 RBC/hpf   WBC, UA TOO NUMEROUS TO COUNT 0 - 5 WBC/hpf   Bacteria, UA NONE SEEN NONE SEEN   Squamous Epithelial / LPF 0-5 (A) NONE SEEN   Mucous PRESENT    Amorphous Crystal PRESENT   Pregnancy, urine     Status: None   Collection Time: 03/21/17  5:42 PM  Result Value Ref Range   Preg Test, Ur NEGATIVE NEGATIVE  Urine Culture     Status: Abnormal   Collection Time: 03/21/17  5:42 PM  Result Value Ref Range   Specimen Description URINE, RANDOM    Special Requests NONE    Culture MULTIPLE SPECIES PRESENT, SUGGEST RECOLLECTION (A)    Report Status 03/23/2017 FINAL   Blood Culture (routine x 2)     Status: None   Collection Time: 03/21/17 10:07 PM  Result Value Ref Range   Specimen Description BLOOD LT ARM    Special Requests      BOTTLES DRAWN AEROBIC AND ANAEROBIC Blood Culture results may not be optimal due to an excessive volume of blood received in culture bottles   Culture NO GROWTH 9 DAYS    Report Status 03/30/2017 FINAL   Lactic acid, plasma     Status: None   Collection Time: 03/21/17 10:07 PM  Result Value Ref Range  Lactic Acid, Venous 0.7 0.5 - 1.9 mmol/L  Urine Culture     Status: Abnormal   Collection Time: 03/21/17 10:48 PM    Result Value Ref Range   Specimen Description      KIDNEY Performed at [facility], [place] [address], [place], [place] [address]    Special Requests NONE    Culture >=100,000 COLONIES/mL STAPHYLOCOCCUS AUREUS (A)    Report Status 03/24/2017 FINAL    Organism ID, Bacteria STAPHYLOCOCCUS AUREUS (A)       Susceptibility   Staphylococcus aureus - MIC*    CIPROFLOXACIN <=0.5 SENSITIVE Sensitive     ERYTHROMYCIN >=8 RESISTANT Resistant     GENTAMICIN <=0.5 SENSITIVE Sensitive     OXACILLIN 0.5 SENSITIVE Sensitive     TETRACYCLINE <=1 SENSITIVE Sensitive     VANCOMYCIN <=0.5 SENSITIVE Sensitive     TRIMETH/SULFA <=10 SENSITIVE Sensitive     CLINDAMYCIN RESISTANT Resistant     RIFAMPIN <=0.5 SENSITIVE Sensitive     Inducible Clindamycin POSITIVE Resistant     * >=100,000 COLONIES/mL STAPHYLOCOCCUS AUREUS  Blood Culture (routine x 2)     Status: None   Collection Time: 03/21/17 11:10 PM  Result Value Ref Range   Specimen Description BLOOD RT HAND    Special Requests      BOTTLES DRAWN AEROBIC AND ANAEROBIC Blood Culture adequate volume   Culture NO GROWTH 9 DAYS    Report Status 03/30/2017 FINAL   Lactic acid, plasma     Status: None   Collection Time: 03/21/17 11:11 PM  Result Value Ref Range   Lactic Acid, Venous 0.9 0.5 - 1.9 mmol/L  HIV antibody (Routine Testing)     Status: None   Collection Time: 03/22/17  4:20 AM  Result Value Ref Range   HIV Screen 4th Generation wRfx Non Reactive Non Reactive    Comment: (NOTE) Performed At: [facility] [place], [place] [id number] Lindon Romp MD [id number]   CBC     Status: Abnormal   Collection Time: 03/22/17  4:20 AM  Result Value Ref Range   WBC 17.0 (H) 3.6 - 11.0 K/uL   RBC 4.19 3.80 - 5.20 MIL/uL   Hemoglobin 12.7 12.0 - 16.0 g/dL   HCT 37.1 35.0 - 47.0 %   MCV 88.5 80.0 - 100.0 fL   MCH 30.3 26.0 - 34.0 pg   MCHC 34.2 32.0 - 36.0 g/dL   RDW 12.9 11.5 - 14.5 %   Platelets 202 150 - 440  K/uL  Comprehensive metabolic panel     Status: Abnormal   Collection Time: 03/22/17  4:20 AM  Result Value Ref Range   Sodium 138 135 - 145 mmol/L   Potassium 4.1 3.5 - 5.1 mmol/L   Chloride 107 101 - 111 mmol/L   CO2 25 22 - 32 mmol/L   Glucose, Bld 137 (H) 65 - 99 mg/dL   BUN 14 6 - 20 mg/dL   Creatinine, Ser 0.86 0.44 - 1.00 mg/dL   Calcium 8.2 (L) 8.9 - 10.3 mg/dL   Total Protein 6.6 6.5 - 8.1 g/dL   Albumin 3.4 (L) 3.5 - 5.0 g/dL   AST 18 15 - 41 U/L   ALT 16 14 - 54 U/L   Alkaline Phosphatase 43 38 - 126 U/L   Total Bilirubin 0.9 0.3 - 1.2 mg/dL   GFR calc non Af Amer >60 >60 mL/min   GFR calc Af Amer >60 >60 mL/min    Comment: (  NOTE) The eGFR has been calculated using the CKD EPI equation. This calculation has not been validated in all clinical situations. eGFR's persistently <60 mL/min signify possible Chronic Kidney Disease.    Anion gap 6 5 - 15  Urine Culture     Status: None   Collection Time: 03/30/17  4:45 PM  Result Value Ref Range   Specimen Description URINE, CLEAN CATCH    Special Requests NONE    Culture      NO GROWTH Performed at [facility], [place] [address] [place],  [address]    Report Status 03/31/2017 FINAL   Pregnancy, urine POC     Status: None   Collection Time: 04/05/17  9:08 AM  Result Value Ref Range   Preg Test, Ur NEGATIVE NEGATIVE    Comment:        THE SENSITIVITY OF THIS METHODOLOGY IS >24 mIU/mL   Stone analysis     Status: None   Collection Time: 04/05/17 11:47 AM  Result Value Ref Range   Color Tan    Size Comment mm    Comment: Specimens received as a mixture of whole stones and fragments.   Stone Weight KSTONE 303.4 mg   Nidus No Nidus visualized    Ca Oxalate,Dihydrate 20 %   Ca Oxalate,Monohydr. 60 %   Ca phos cry stone ql IR 20 %   Composition Comment     Comment: Percentage (Represents the % composition)   SURFACE CRYSTALS Comment:     Comment: Calcium oxalate dihydrate   Photo Comment      Comment: Photograph will follow under separate cover.   Comment: Comment     Comment: (NOTE) Physician questions regarding Calculi Analysis contact [facility] at: [phone].    PLEASE NOTE: Comment     Comment: (NOTE) Calculi report with photograph will follow via computer, mail or courier delivery.    Disclaimer - Kidney Stone Analysis: Comment     Comment: (NOTE) This test was developed and its performance characteristics determined by [facility]. It has not been cleared or approved by the Food and Drug Administration. Performed At: [facility] [place], [place] [id number] Lindon Romp MD [id number]     Assessment/Plan: Bipolar depression ([place]) Will decrease Abilify to 10 mg daily. Supportive measures reviewed. Close follow-up scheduled. Alarm signs/symptoms reviewed that would prompt ER assessment.     Leeanne Rio, PA-C

## 2017-04-19 ENCOUNTER — Ambulatory Visit (INDEPENDENT_AMBULATORY_CARE_PROVIDER_SITE_OTHER): Payer: BLUE CROSS/BLUE SHIELD | Admitting: Physician Assistant

## 2017-04-19 ENCOUNTER — Encounter: Payer: Self-pay | Admitting: Physician Assistant

## 2017-04-19 VITALS — BP 110/76 | HR 89 | Temp 97.4°F | Resp 14 | Ht 63.0 in | Wt 173.0 lb

## 2017-04-19 DIAGNOSIS — F319 Bipolar disorder, unspecified: Secondary | ICD-10-CM

## 2017-04-19 DIAGNOSIS — F3130 Bipolar disorder, current episode depressed, mild or moderate severity, unspecified: Secondary | ICD-10-CM | POA: Diagnosis not present

## 2017-04-19 MED ORDER — ARIPIPRAZOLE 10 MG PO TABS: 10.0000 mg | ORAL_TABLET | Freq: Every day | ORAL | 1 refills | Status: DC

## 2017-04-19 MED ORDER — ARIPIPRAZOLE 5 MG PO TABS: 10.0000 mg | ORAL_TABLET | Freq: Every day | ORAL | 1 refills | Status: DC

## 2017-04-19 NOTE — Addendum Note (Signed)
Addended by: Waldon MerlMARTIN, Dejan Angert C on: 04/19/2017 12:01 PM   Modules accepted: Orders

## 2017-04-19 NOTE — Progress Notes (Signed)
Pre visit review using our clinic review tool, if applicable. No additional management support is needed unless otherwise documented below in the visit note. 

## 2017-04-19 NOTE — Assessment & Plan Note (Signed)
Will decrease Abilify to 10 mg daily. Supportive measures reviewed. Close follow-up scheduled. Alarm signs/symptoms reviewed that would prompt ER assessment.

## 2017-04-19 NOTE — Patient Instructions (Signed)
Please start the new dose of Abilify (10 mg) daily. This should help keep mood stable but calm down the sleepiness. Start a daily probiotic (Align, Digestive Advantage, Culturelle are some brands). Work on increased exercise as this helps with stress relief and mood.   Follow-up in 1 month. If you note any worsening symptoms, call or come see me. If there are any severe symptoms or thoughts of harming yourself or others, please call 911 or go to the ER.

## 2017-05-03 ENCOUNTER — Ambulatory Visit
Admission: RE | Admit: 2017-05-03 | Discharge: 2017-05-03 | Disposition: A | Discharge status: Home / Self Care | Payer: BLUE CROSS/BLUE SHIELD | Source: Ambulatory Visit | Attending: Urology | Admitting: Urology

## 2017-05-03 DIAGNOSIS — N200 Calculus of kidney: Secondary | ICD-10-CM

## 2017-05-03 DIAGNOSIS — K769 Liver disease, unspecified: Secondary | ICD-10-CM | POA: Insufficient documentation

## 2017-05-03 DIAGNOSIS — N1330 Unspecified hydronephrosis: Secondary | ICD-10-CM | POA: Insufficient documentation

## 2017-05-03 DIAGNOSIS — Z09 Encounter for follow-up examination after completed treatment for conditions other than malignant neoplasm: Secondary | ICD-10-CM | POA: Insufficient documentation

## 2017-05-06 ENCOUNTER — Encounter: Payer: Self-pay | Admitting: Urology

## 2017-05-06 ENCOUNTER — Ambulatory Visit (INDEPENDENT_AMBULATORY_CARE_PROVIDER_SITE_OTHER): Payer: BLUE CROSS/BLUE SHIELD | Admitting: Urology

## 2017-05-06 VITALS — BP 117/79 | HR 106 | Ht 63.0 in | Wt 173.0 lb

## 2017-05-06 DIAGNOSIS — R109 Unspecified abdominal pain: Secondary | ICD-10-CM | POA: Diagnosis not present

## 2017-05-06 DIAGNOSIS — N200 Calculus of kidney: Secondary | ICD-10-CM

## 2017-05-06 DIAGNOSIS — N1330 Unspecified hydronephrosis: Secondary | ICD-10-CM

## 2017-05-06 NOTE — Progress Notes (Signed)
05/06/2017 10:05 AM   Ander SladeErin Uhler-Lynch 1980-12-08 [id number]  Referring provider: Waldon MerlMartin, William C, PA-C [address] [place], [place] [id number]  Chief Complaint  Patient presents with  . Nephrolithiasis    1 month w/RUS    HPI: 36 year old female who returns today following right-sided ureteroscopy for an object and 1.3 mm right UPJ stone. She initially percent with urosepsis and underwent stent placement. She returned to the operating room on 01/03/2017 for definitive management of her stone. In addition, a few nonobstructing stones were also treated.  Stone analysis consistent with 20% calcium oxalate dihydrate, 6 to percent calcium oxalate monohydrate, 20% calcium phosphate.  Postop, she's had a few issues. She had complete resolution of her urinary symptoms as well as flank pain following stent removal. Over the past 3 days, she is developed mild right flank pain. She describes this as constant and not nearly as severe as when she present with her stone. No nausea or vomiting. No dysuria, gross hematuria, fevers, or chills.  She denies a personal history of kidney stones. She admits a prior to the procedure, she did not drink nearly enough water. She is been dramatically increasing her fluid intake since surgery.  Follow up renal ultrasound shows some very mild right-sided hydronephrosis with bilateral ureteral jets. There is either a calcification or debris residual measuring approximately 7 mm on ultrasound.   PMH: Past Medical History:  Diagnosis Date  . Bipolar disorder (HCC)   . Depression   . GERD (gastroesophageal reflux disease)   . Hair loss   . History of kidney stones   . Irregular menstrual cycle   . Migraines   . Obesity   . Palpitation     Surgical History: Past Surgical History:  Procedure Laterality Date  . CYSTOSCOPY W/ URETERAL STENT PLACEMENT Right 04/05/2017   Procedure: CYSTOSCOPY WITH STENT REPLACEMENT;  Surgeon: Vanna ScotlandBrandon, Ezell Melikian, MD;   Location: [facility];  Service: Urology;  Laterality: Right;  . CYSTOSCOPY WITH STENT PLACEMENT Right 03/21/2017   Procedure: CYSTOSCOPY WITH STENT PLACEMENT;  Surgeon: Bjorn PippinWrenn, John, MD;  Location: [facility];  Service: Urology;  Laterality: Right;  . DILATION AND CURETTAGE OF UTERUS  12/27/2013  . TUBAL LIGATION    . URETEROSCOPY WITH HOLMIUM LASER LITHOTRIPSY Right 04/05/2017   Procedure: URETEROSCOPY WITH HOLMIUM LASER LITHOTRIPSY;  Surgeon: Vanna ScotlandBrandon, Delia Slatten, MD;  Location: [facility];  Service: Urology;  Laterality: Right;    Home Medications:  Allergies as of 05/06/2017      Reactions   Adhesive [tape] Rash      Medication List       Accurate as of 05/06/17 10:05 AM. Always use your most recent med list.          acetaminophen 325 MG tablet Commonly known as:  TYLENOL Take 650 mg by mouth every 4 (four) hours as needed for mild pain.   ARIPiprazole 10 MG tablet Commonly known as:  ABILIFY Take 1 tablet (10 mg total) by mouth daily.       Allergies:  Allergies  Allergen Reactions  . Adhesive [Tape] Rash    Family History: Family History  Problem Relation Age of Onset  . Metabolic syndrome Mother   . Cancer Mother        Ovarian and Cervical  . COPD Father     Social History:  reports that she has been smoking Cigarettes.  She has a 1.00 pack-year smoking history. She has never used smokeless tobacco. She reports that she  does not drink alcohol or use drugs.  ROS: UROLOGY Frequent Urination?: No Hard to postpone urination?: No Burning/pain with urination?: No Get up at night to urinate?: No Leakage of urine?: No Urine stream starts and stops?: No Trouble starting stream?: No Do you have to strain to urinate?: No Blood in urine?: No Urinary tract infection?: No Sexually transmitted disease?: No Injury to kidneys or bladder?: No Painful intercourse?: No Weak stream?: No Currently pregnant?: No Vaginal bleeding?: No Last menstrual period?:  n  Gastrointestinal Nausea?: No Vomiting?: No Indigestion/heartburn?: No Diarrhea?: No Constipation?: No  Constitutional Fever: No Night sweats?: No Weight loss?: No Fatigue?: No  Skin Skin rash/lesions?: No Itching?: No  Eyes Blurred vision?: No Double vision?: No  Ears/Nose/Throat Sore throat?: No Sinus problems?: No  Hematologic/Lymphatic Swollen glands?: No Easy bruising?: No  Cardiovascular Leg swelling?: No Chest pain?: No  Respiratory Cough?: No Shortness of breath?: No  Endocrine Excessive thirst?: No  Musculoskeletal Back pain?: No Joint pain?: No  Neurological Headaches?: No Dizziness?: No  Psychologic Depression?: No Anxiety?: No  Physical Exam: BP 117/79   Pulse (!) 106   Ht 5\' 3"  (1.6 m)   Wt 173 lb (78.5 kg)   LMP 04/18/2017 (Approximate)   BMI 30.65 kg/m   Constitutional:  Alert and oriented, No acute distress. HEENT: [place] AT, moist mucus membranes.  Trachea midline, no masses. Cardiovascular: No clubbing, cyanosis, or edema. Respiratory: Normal respiratory effort, no increased work of breathing. GI: Abdomen is soft, nontender, nondistended, no abdominal masses GU: Mild right CVA tenderness with deep palpation. Skin: No rashes, bruises or suspicious lesions. Neurologic: Grossly intact, no focal deficits, moving all 4 extremities. Psychiatric: Normal mood and affect.  Laboratory Data: Lab Results  Component Value Date   WBC 17.0 (H) 03/22/2017   HGB 12.7 03/22/2017   HCT 37.1 03/22/2017   MCV 88.5 03/22/2017   PLT 202 03/22/2017    Lab Results  Component Value Date   CREATININE 0.86 03/22/2017   Urinalysis N/a  Pertinent Imaging: CLINICAL DATA:  RIGHT nephrolithiasis, follow-up post RIGHT ureteroscopy  EXAM: RENAL / URINARY TRACT ULTRASOUND COMPLETE  COMPARISON:  CT abdomen and pelvis 03/21/2017  FINDINGS: Right Kidney:  Length: 10.9 cm. Normal cortical thickness and echogenicity.  Mild hydronephrosis. No mass identified. 7 mm echogenic focus at mid kidney without definite shadowing, questionable for nonobstructing calculus.  Left Kidney:  Length: 10.6 cm.  Normal morphology without mass or hydronephrosis.  Bladder:  Moderately well distended without mass. BILATERAL ureteral jets noted.  Incidentally noted: Echogenic hepatic parenchyma, likely fatty infiltration though this can be seen with cirrhosis and certain infiltrative disorders.  IMPRESSION: Mild LEFT hydronephrosis.  Questionable 7 mm nonobstructing RIGHT renal calculus.  BILATERAL ureteral jets noted at the urinary bladder.  Question fatty infiltration of liver as above.   Electronically Signed   By: Ulyses SouthwardMark  Boles M.D.   On: 05/03/2017 15:11  Renal ultrasound was personally reviewed today and compared to previous CT stone protocol on 03/21/2017..  Assessment & Plan:   1. Right kidney stone Status post right ureteroscopy Recurrence of right flank pain, see below We discussed general stone prevention techniques including drinking plenty water with goal of producing 2.5 L urine daily, increased citric acid intake, avoidance of high oxalate containing foods, and decreased salt intake.  Information about dietary recommendations given today.  Discussed whether or not to proceed with 24-hour urine metabolic workup, we'll defer at this time but they recommend if she continues to have stone episdoes  2. Hydronephrosis of right kidney Very subtle right-sided fullness Bilateral ureteral jets reassuring, rules out high-grade obstruction In the setting of flank pain, this may be related to passing of stone debris vs. Related to chronicity of obstruction vs. Residual edema vs.  Stricture Recommend follow-up with renal ultrasound in 2 months to assess for resolution allowing time for interval passage - US Renal; Future  3. Right flank pain In the setting of very mild residual hydronephrosis,  this may be related to ongoing low-grade obstruction secondary to above versus musculoskeletal pain No symptoms of infection as contracting factor Other etiologies include possible MSK pain Recommend supportive care at this time including NSAIDs, hydration- warning symptoms are reviewed and if flank pain feels resolve, advised to call our office next week   Return in about 1 year (around 05/06/2018) for RUS in 2 months (will call with results, then MD visit in 1 year with KUB.  Vanna Scotland, MD  [facility] [address] [place], [place] [address] [phone]

## 2017-05-17 ENCOUNTER — Ambulatory Visit (INDEPENDENT_AMBULATORY_CARE_PROVIDER_SITE_OTHER): Payer: BLUE CROSS/BLUE SHIELD | Admitting: Physician Assistant

## 2017-05-17 ENCOUNTER — Encounter: Payer: Self-pay | Admitting: Physician Assistant

## 2017-05-17 VITALS — BP 100/62 | HR 89 | Temp 98.5°F | Resp 14 | Ht 63.0 in | Wt 172.0 lb

## 2017-05-17 DIAGNOSIS — F319 Bipolar disorder, unspecified: Secondary | ICD-10-CM

## 2017-05-17 DIAGNOSIS — F3130 Bipolar disorder, current episode depressed, mild or moderate severity, unspecified: Secondary | ICD-10-CM

## 2017-05-17 LAB — CBC WITH DIFFERENTIAL/PLATELET
BASOS PCT: 0.5 % (ref 0.0–3.0)
Basophils Absolute: 0 10*3/uL (ref 0.0–0.1)
EOS PCT: 2.9 % (ref 0.0–5.0)
Eosinophils Absolute: 0.2 10*3/uL (ref 0.0–0.7)
HEMATOCRIT: 39.6 % (ref 36.0–46.0)
HEMOGLOBIN: 13.3 g/dL (ref 12.0–15.0)
LYMPHS PCT: 27.2 % (ref 12.0–46.0)
Lymphs Abs: 1.7 10*3/uL (ref 0.7–4.0)
MCHC: 33.6 g/dL (ref 30.0–36.0)
MCV: 90.8 fl (ref 78.0–100.0)
MONOS PCT: 7 % (ref 3.0–12.0)
Monocytes Absolute: 0.4 10*3/uL (ref 0.1–1.0)
Neutro Abs: 3.9 10*3/uL (ref 1.4–7.7)
Neutrophils Relative %: 62.4 % (ref 43.0–77.0)
Platelets: 229 10*3/uL (ref 150.0–400.0)
RBC: 4.36 Mil/uL (ref 3.87–5.11)
RDW: 13.4 % (ref 11.5–15.5)
WBC: 6.2 10*3/uL (ref 4.0–10.5)

## 2017-05-17 MED ORDER — ARIPIPRAZOLE 10 MG PO TABS: 10.0000 mg | ORAL_TABLET | Freq: Every day | ORAL | 1 refills | Status: DC

## 2017-05-17 NOTE — Patient Instructions (Signed)
Please go to the lab for blood work. We will call you with your results.  I am glad you are doing so well. Please continue current medication regimen. We will follow-up in 3 months. Return sooner if needed!

## 2017-05-17 NOTE — Assessment & Plan Note (Signed)
Doing very well with medication changes. Continue current regimen. CBC today. Follow-up 3 months.

## 2017-05-17 NOTE — Progress Notes (Signed)
Patient presents to clinic today for follow-up of Bipolar Depression. At last visit Abilify was decreased from 15 mg to 10 mg daily due to somnolence. Patient endorses taking new dose of medication as directed. Endorses significant improvement in mood with change in medication. Is sleeping well without hypersomnolence. Notes decreased irritability. No change in appetite.  Denies SI/HI.   Past Medical History:  Diagnosis Date  . Bipolar disorder ([place])   . Depression   . GERD (gastroesophageal reflux disease)   . Hair loss   . History of kidney stones   . Irregular menstrual cycle   . Migraines   . Obesity   . Palpitation     Current Outpatient Prescriptions on File Prior to Visit  Medication Sig Dispense Refill  . acetaminophen (TYLENOL) 325 MG tablet Take 650 mg by mouth every 4 (four) hours as needed for mild pain.     No current facility-administered medications on file prior to visit.     Allergies  Allergen Reactions  . Adhesive [Tape] Rash    Family History  Problem Relation Age of Onset  . Metabolic syndrome Mother   . Cancer Mother        Ovarian and Cervical  . COPD Father     Social History   Social History  . Marital status: Married    Spouse name: N/A  . Number of children: N/A  . Years of education: N/A   Social History Main Topics  . Smoking status: Current Every Day Smoker    Packs/day: 0.50    Years: 2.00    Types: Cigarettes  . Smokeless tobacco: Never Used  . Alcohol use No     Comment: rare  . Drug use: No  . Sexual activity: Yes    Partners: Female   Other Topics Concern  . None   Social History Narrative  . None   Review of Systems - See HPI.  All other ROS are negative.  BP 100/62   Pulse 89   Temp 98.5 F (36.9 C) (Oral)   Resp 14   Ht '5\' 3"'  (1.6 m)   Wt 172 lb (78 kg)   LMP 04/18/2017 (Approximate)   SpO2 98%   BMI 30.47 kg/m   Physical Exam  Recent Results (from the past 2160 hour(s))  Basic metabolic panel      Status: Abnormal   Collection Time: 03/21/17  5:41 PM  Result Value Ref Range   Sodium 134 (L) 135 - 145 mmol/L   Potassium 3.4 (L) 3.5 - 5.1 mmol/L   Chloride 101 101 - 111 mmol/L   CO2 23 22 - 32 mmol/L   Glucose, Bld 116 (H) 65 - 99 mg/dL   BUN 17 6 - 20 mg/dL   Creatinine, Ser 1.04 (H) 0.44 - 1.00 mg/dL   Calcium 8.9 8.9 - 10.3 mg/dL   GFR calc non Af Amer >60 >60 mL/min   GFR calc Af Amer >60 >60 mL/min    Comment: (NOTE) The eGFR has been calculated using the CKD EPI equation. This calculation has not been validated in all clinical situations. eGFR's persistently <60 mL/min signify possible Chronic Kidney Disease.    Anion gap 10 5 - 15  CBC     Status: Abnormal   Collection Time: 03/21/17  5:41 PM  Result Value Ref Range   WBC 15.1 (H) 3.6 - 11.0 K/uL   RBC 4.54 3.80 - 5.20 MIL/uL   Hemoglobin 13.7 12.0 - 16.0 g/dL  HCT 39.5 35.0 - 47.0 %   MCV 87.1 80.0 - 100.0 fL   MCH 30.1 26.0 - 34.0 pg   MCHC 34.5 32.0 - 36.0 g/dL   RDW 12.9 11.5 - 14.5 %   Platelets 213 150 - 440 K/uL  Differential     Status: Abnormal   Collection Time: 03/21/17  5:41 PM  Result Value Ref Range   Neutrophils Relative % 88 %   Neutro Abs 13.2 (H) 1.4 - 6.5 K/uL   Lymphocytes Relative 4 %   Lymphs Abs 0.6 (L) 1.0 - 3.6 K/uL   Monocytes Relative 8 %   Monocytes Absolute 1.2 (H) 0.2 - 0.9 K/uL   Eosinophils Relative 0 %   Eosinophils Absolute 0.0 0 - 0.7 K/uL   Basophils Relative 0 %   Basophils Absolute 0.1 0 - 0.1 K/uL  Urinalysis, Complete w Microscopic     Status: Abnormal   Collection Time: 03/21/17  5:42 PM  Result Value Ref Range   Color, Urine YELLOW (A) YELLOW   APPearance HAZY (A) CLEAR   Specific Gravity, Urine 1.025 1.005 - 1.030   pH 5.0 5.0 - 8.0   Glucose, UA NEGATIVE NEGATIVE mg/dL   Hgb urine dipstick MODERATE (A) NEGATIVE   Bilirubin Urine NEGATIVE NEGATIVE   Ketones, ur 80 (A) NEGATIVE mg/dL   Protein, ur 100 (A) NEGATIVE mg/dL   Nitrite NEGATIVE NEGATIVE    Leukocytes, UA MODERATE (A) NEGATIVE   RBC / HPF 6-30 0 - 5 RBC/hpf   WBC, UA TOO NUMEROUS TO COUNT 0 - 5 WBC/hpf   Bacteria, UA NONE SEEN NONE SEEN   Squamous Epithelial / LPF 0-5 (A) NONE SEEN   Mucous PRESENT    Amorphous Crystal PRESENT   Pregnancy, urine     Status: None   Collection Time: 03/21/17  5:42 PM  Result Value Ref Range   Preg Test, Ur NEGATIVE NEGATIVE  Urine Culture     Status: Abnormal   Collection Time: 03/21/17  5:42 PM  Result Value Ref Range   Specimen Description URINE, RANDOM    Special Requests NONE    Culture MULTIPLE SPECIES PRESENT, SUGGEST RECOLLECTION (A)    Report Status 03/23/2017 FINAL   Blood Culture (routine x 2)     Status: None   Collection Time: 03/21/17 10:07 PM  Result Value Ref Range   Specimen Description BLOOD LT ARM    Special Requests      BOTTLES DRAWN AEROBIC AND ANAEROBIC Blood Culture results may not be optimal due to an excessive volume of blood received in culture bottles   Culture NO GROWTH 9 DAYS    Report Status 03/30/2017 FINAL   Lactic acid, plasma     Status: None   Collection Time: 03/21/17 10:07 PM  Result Value Ref Range   Lactic Acid, Venous 0.7 0.5 - 1.9 mmol/L  Urine Culture     Status: Abnormal   Collection Time: 03/21/17 10:48 PM  Result Value Ref Range   Specimen Description      KIDNEY Performed at [facility], [address], [place], [place] [address]    Special Requests NONE    Culture >=100,000 COLONIES/mL STAPHYLOCOCCUS AUREUS (A)    Report Status 03/24/2017 FINAL    Organism ID, Bacteria STAPHYLOCOCCUS AUREUS (A)       Susceptibility   Staphylococcus aureus - MIC*    CIPROFLOXACIN <=0.5 SENSITIVE Sensitive     ERYTHROMYCIN >=8 RESISTANT Resistant     GENTAMICIN <=  0.5 SENSITIVE Sensitive     OXACILLIN 0.5 SENSITIVE Sensitive     TETRACYCLINE <=1 SENSITIVE Sensitive     VANCOMYCIN <=0.5 SENSITIVE Sensitive     TRIMETH/SULFA <=10 SENSITIVE Sensitive     CLINDAMYCIN RESISTANT Resistant      RIFAMPIN <=0.5 SENSITIVE Sensitive     Inducible Clindamycin POSITIVE Resistant     * >=100,000 COLONIES/mL STAPHYLOCOCCUS AUREUS  Blood Culture (routine x 2)     Status: None   Collection Time: 03/21/17 11:10 PM  Result Value Ref Range   Specimen Description BLOOD RT HAND    Special Requests      BOTTLES DRAWN AEROBIC AND ANAEROBIC Blood Culture adequate volume   Culture NO GROWTH 9 DAYS    Report Status 03/30/2017 FINAL   Lactic acid, plasma     Status: None   Collection Time: 03/21/17 11:11 PM  Result Value Ref Range   Lactic Acid, Venous 0.9 0.5 - 1.9 mmol/L  HIV antibody (Routine Testing)     Status: None   Collection Time: 03/22/17  4:20 AM  Result Value Ref Range   HIV Screen 4th Generation wRfx Non Reactive Non Reactive    Comment: (NOTE) Performed At: [facility] [place], [place] [id number] Lindon Romp MD LK:[id number]   CBC     Status: Abnormal   Collection Time: 03/22/17  4:20 AM  Result Value Ref Range   WBC 17.0 (H) 3.6 - 11.0 K/uL   RBC 4.19 3.80 - 5.20 MIL/uL   Hemoglobin 12.7 12.0 - 16.0 g/dL   HCT 37.1 35.0 - 47.0 %   MCV 88.5 80.0 - 100.0 fL   MCH 30.3 26.0 - 34.0 pg   MCHC 34.2 32.0 - 36.0 g/dL   RDW 12.9 11.5 - 14.5 %   Platelets 202 150 - 440 K/uL  Comprehensive metabolic panel     Status: Abnormal   Collection Time: 03/22/17  4:20 AM  Result Value Ref Range   Sodium 138 135 - 145 mmol/L   Potassium 4.1 3.5 - 5.1 mmol/L   Chloride 107 101 - 111 mmol/L   CO2 25 22 - 32 mmol/L   Glucose, Bld 137 (H) 65 - 99 mg/dL   BUN 14 6 - 20 mg/dL   Creatinine, Ser 0.86 0.44 - 1.00 mg/dL   Calcium 8.2 (L) 8.9 - 10.3 mg/dL   Total Protein 6.6 6.5 - 8.1 g/dL   Albumin 3.4 (L) 3.5 - 5.0 g/dL   AST 18 15 - 41 U/L   ALT 16 14 - 54 U/L   Alkaline Phosphatase 43 38 - 126 U/L   Total Bilirubin 0.9 0.3 - 1.2 mg/dL   GFR calc non Af Amer >60 >60 mL/min   GFR calc Af Amer >60 >60 mL/min    Comment: (NOTE) The eGFR has been calculated  using the CKD EPI equation. This calculation has not been validated in all clinical situations. eGFR's persistently <60 mL/min signify possible Chronic Kidney Disease.    Anion gap 6 5 - 15  Urine Culture     Status: None   Collection Time: 03/30/17  4:45 PM  Result Value Ref Range   Specimen Description URINE, CLEAN CATCH    Special Requests NONE    Culture      NO GROWTH Performed at [facility], [place] [address], [place], [place] [address]    Report Status 03/31/2017 FINAL   Pregnancy, urine POC     Status: None  Collection Time: 04/05/17  9:08 AM  Result Value Ref Range   Preg Test, Ur NEGATIVE NEGATIVE    Comment:        THE SENSITIVITY OF THIS METHODOLOGY IS >24 mIU/mL   Stone analysis     Status: None   Collection Time: 04/05/17 11:47 AM  Result Value Ref Range   Color Tan    Size Comment mm    Comment: Specimens received as a mixture of whole stones and fragments.   Stone Weight KSTONE 303.4 mg   Nidus No Nidus visualized    Ca Oxalate,Dihydrate 20 %   Ca Oxalate,Monohydr. 60 %   Ca phos cry stone ql IR 20 %   Composition Comment     Comment: Percentage (Represents the % composition)   SURFACE CRYSTALS Comment:     Comment: Calcium oxalate dihydrate   Photo Comment     Comment: Photograph will follow under separate cover.   Comment: Comment     Comment: (NOTE) Physician questions regarding Calculi Analysis contact [facility] at: [phone].    PLEASE NOTE: Comment     Comment: (NOTE) Calculi report with photograph will follow via computer, mail or courier delivery.    Disclaimer - Kidney Stone Analysis: Comment     Comment: (NOTE) This test was developed and its performance characteristics determined by [facility]. It has not been cleared or approved by the Food and Drug Administration. Performed At: [facility] [place], [place] [id number] Lindon Romp MD WB:[id number]     Assessment/Plan: Bipolar depression  [facility]) Doing very well with medication changes. Continue current regimen. CBC today. Follow-up 3 months.     Leeanne Rio, PA-C

## 2017-05-17 NOTE — Progress Notes (Signed)
Pre visit review using our clinic review tool, if applicable. No additional management support is needed unless otherwise documented below in the visit note. 

## 2017-06-30 ENCOUNTER — Other Ambulatory Visit: Payer: Self-pay | Admitting: Physician Assistant

## 2017-08-16 ENCOUNTER — Ambulatory Visit (INDEPENDENT_AMBULATORY_CARE_PROVIDER_SITE_OTHER): Payer: BLUE CROSS/BLUE SHIELD | Admitting: Physician Assistant

## 2017-08-16 ENCOUNTER — Encounter: Payer: Self-pay | Admitting: Physician Assistant

## 2017-08-16 VITALS — BP 100/70 | HR 94 | Temp 97.8°F | Resp 14 | Ht 63.0 in | Wt 173.0 lb

## 2017-08-16 DIAGNOSIS — F3130 Bipolar disorder, current episode depressed, mild or moderate severity, unspecified: Secondary | ICD-10-CM

## 2017-08-16 DIAGNOSIS — F319 Bipolar disorder, unspecified: Secondary | ICD-10-CM

## 2017-08-16 NOTE — Assessment & Plan Note (Signed)
Doing well on Abilify. Continue current regimen. Handout given for counseling. She is to schedule an appointment. Follow-up 6 months.

## 2017-08-16 NOTE — Patient Instructions (Signed)
I am glad you are doing well.  Please continue current regimen. I have given you some options for counseling.  Please call to schedule an appointment.  Follow-up in 6 months. We can do your physical at that time if you are due.

## 2017-08-16 NOTE — Progress Notes (Signed)
Pre visit review using our clinic review tool, if applicable. No additional management support is needed unless otherwise documented below in the visit note. 

## 2017-08-16 NOTE — Progress Notes (Signed)
   Patient presents to clinic today for follow-up of Bipolar Disorder. Patient is currently on a regimen of Abilify 10 mg daily. Is tolerating well without side effect. Notes good mood with medication. Denies mania, breakthrough anxiety. Denies SI/HI.   Past Medical History:  Diagnosis Date  . Bipolar disorder (HCC)   . Depression   . GERD (gastroesophageal reflux disease)   . Hair loss   . History of kidney stones   . Irregular menstrual cycle   . Migraines   . Obesity   . Palpitation     Current Outpatient Medications on File Prior to Visit  Medication Sig Dispense Refill  . acetaminophen (TYLENOL) 325 MG tablet Take 650 mg by mouth every 4 (four) hours as needed for mild pain.    . ARIPiprazole (ABILIFY) 10 MG tablet Take 1 tablet (10 mg total) by mouth daily. 90 tablet 1   No current facility-administered medications on file prior to visit.     Allergies  Allergen Reactions  . Adhesive [Tape] Rash    Family History  Problem Relation Age of Onset  . Metabolic syndrome Mother   . Cancer Mother        Ovarian and Cervical  . COPD Father     Social History   Socioeconomic History  . Marital status: Married    Spouse name: None  . Number of children: None  . Years of education: None  . Highest education level: None  Social Needs  . Financial resource strain: None  . Food insecurity - worry: None  . Food insecurity - inability: None  . Transportation needs - medical: None  . Transportation needs - non-medical: None  Occupational History  . None  Tobacco Use  . Smoking status: Current Every Day Smoker    Packs/day: 0.25    Years: 2.00    Pack years: 0.50    Types: Cigarettes  . Smokeless tobacco: Never Used  Substance and Sexual Activity  . Alcohol use: No    Alcohol/week: 0.0 oz    Comment: rare  . Drug use: No  . Sexual activity: Yes    Partners: Female  Other Topics Concern  . None  Social History Narrative  . None   Review of Systems - See HPI.   All other ROS are negative.  BP 100/70   Pulse 94   Temp 97.8 F (36.6 C) (Oral)   Resp 14   Ht 5\' 3"  (1.6 m)   Wt 173 lb (78.5 kg)   SpO2 99%   BMI 30.65 kg/m   Physical Exam  Constitutional: She is oriented to person, place, and time and well-developed, well-nourished, and in no distress.  HENT:  Head: Normocephalic and atraumatic.  Eyes: Conjunctivae are normal.  Neck: Neck supple.  Cardiovascular: Normal rate, regular rhythm, normal heart sounds and intact distal pulses.  Pulmonary/Chest: Effort normal and breath sounds normal. No respiratory distress. She has no wheezes. She has no rales. She exhibits no tenderness.  Lymphadenopathy:    She has no cervical adenopathy.  Neurological: She is alert and oriented to person, place, and time.  Skin: Skin is warm and dry. No rash noted.  Psychiatric: Affect normal.  Vitals reviewed.  Assessment/Plan: Bipolar depression (HCC) Doing well on Abilify. Continue current regimen. Handout given for counseling. She is to schedule an appointment. Follow-up 6 months.     Piedad ClimesMartin, Markiya Keefe Cody, PA-C

## 2017-12-23 IMAGING — CT CT RENAL STONE PROTOCOL
3 of 4 series · 10 of 46 positions shown, 17 images · non-contrast
Comparison: None.

CLINICAL DATA: Right flank pain 4 days.  Fever.

EXAM:
CT ABDOMEN AND PELVIS WITHOUT CONTRAST
TECHNIQUE: Multidetector CT imaging of the abdomen and pelvis was performed
following the standard protocol without IV contrast.

[Series 4: lung bases · axial · 0.77mm/px · z∈[-213,-113]mm · 6 of 29 slices shown, 11 images]
[im 5/29  soft-tissue]
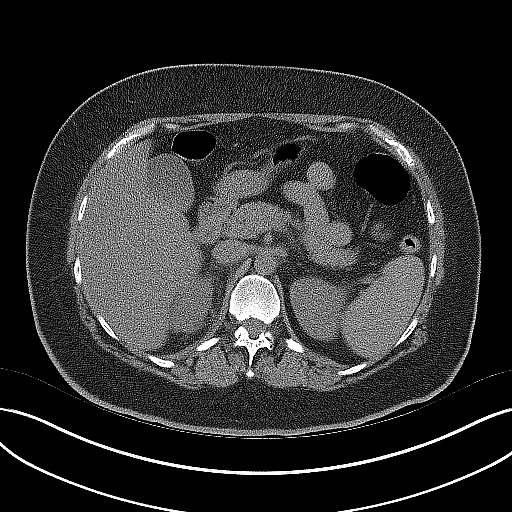
[im 5/29  bone]
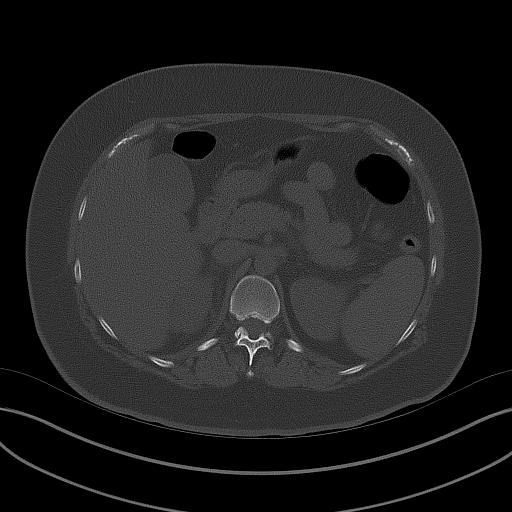
[im 9/29  soft-tissue]
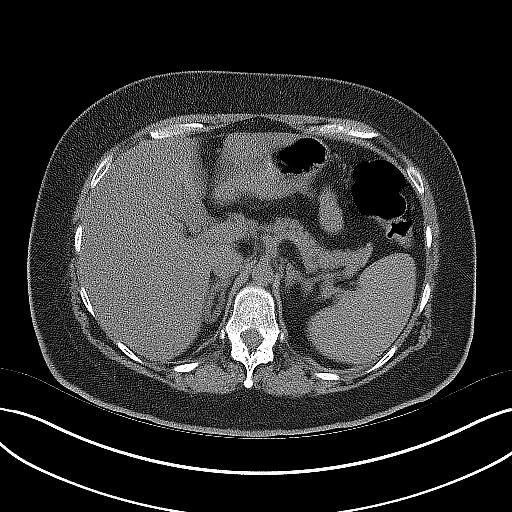
[im 13/29  soft-tissue]
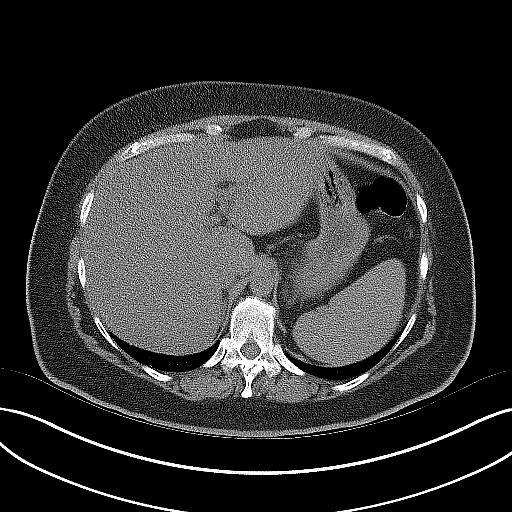
[im 13/29  lung]
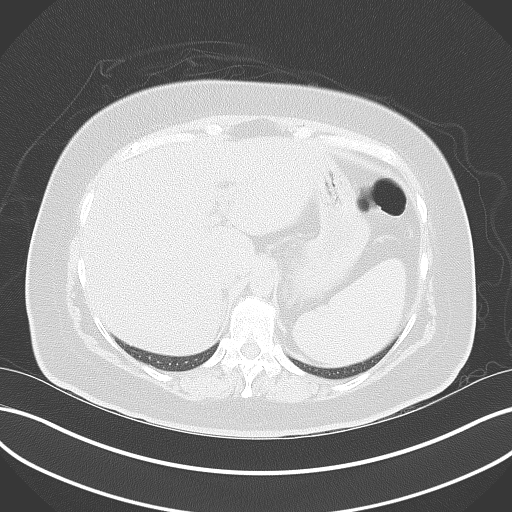
[im 17/29  soft-tissue]
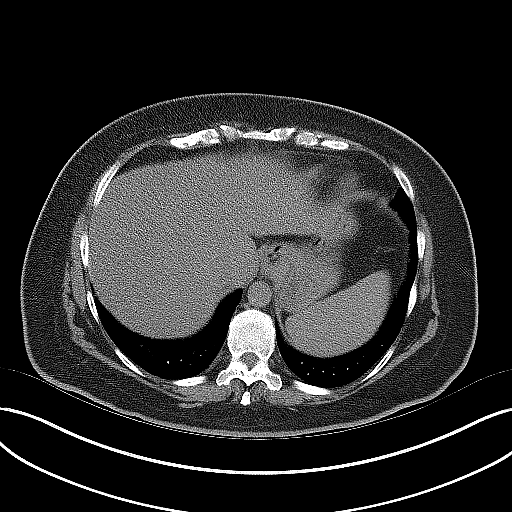
[im 17/29  lung]
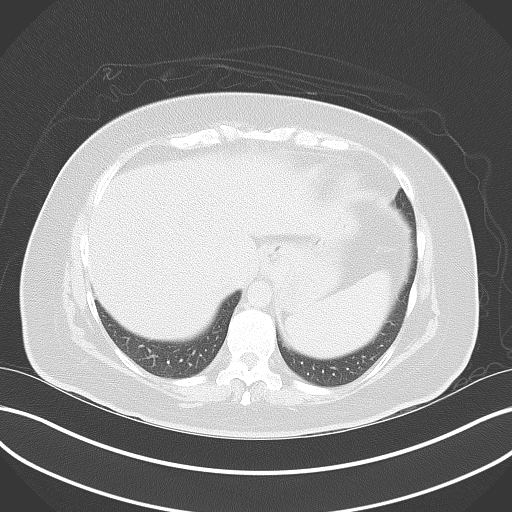
[im 21/29  soft-tissue]
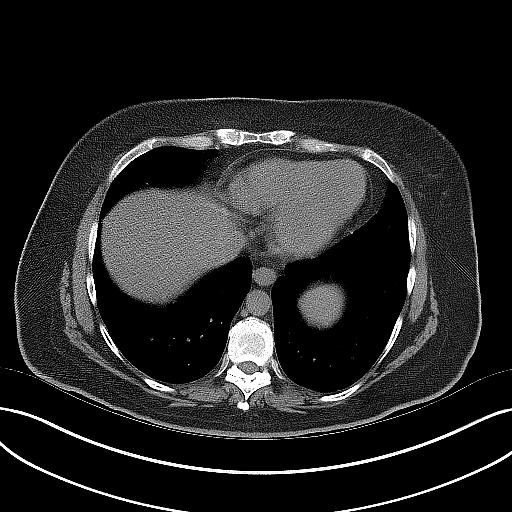
[im 21/29  lung]
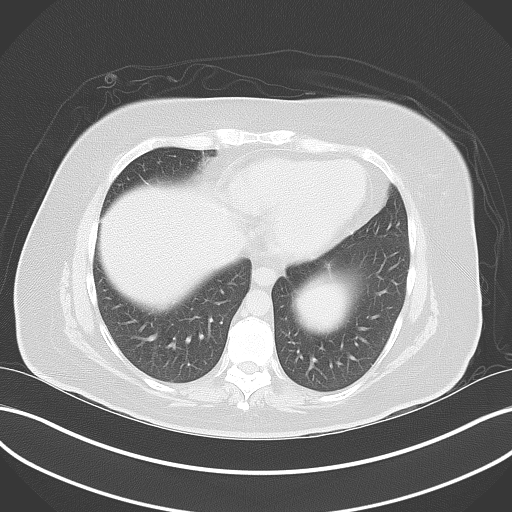
[im 25/29  soft-tissue]
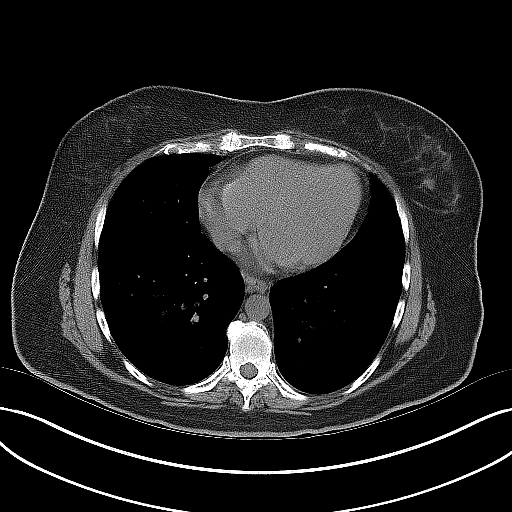
[im 25/29  lung]
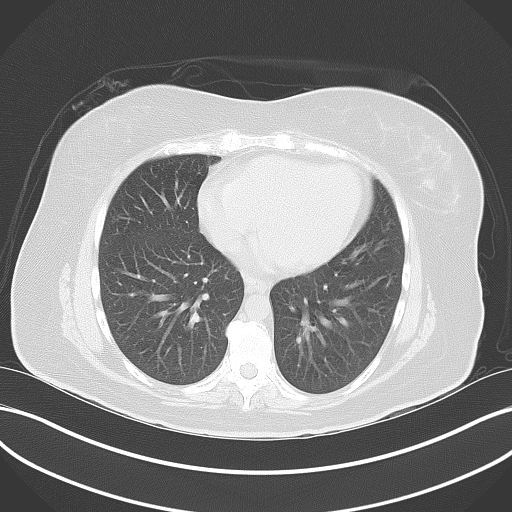

[Series 5: coronal · coronal · 0.73mm/px · 3 of 138 slices shown, 4 images]
[im 46/138  soft-tissue]
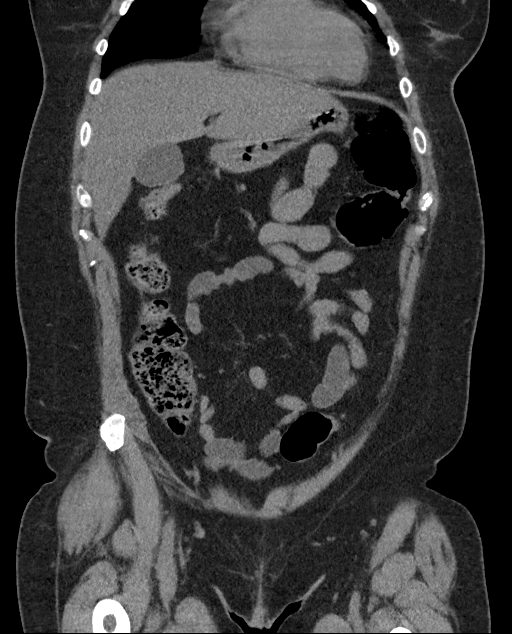
[im 61/138  soft-tissue]
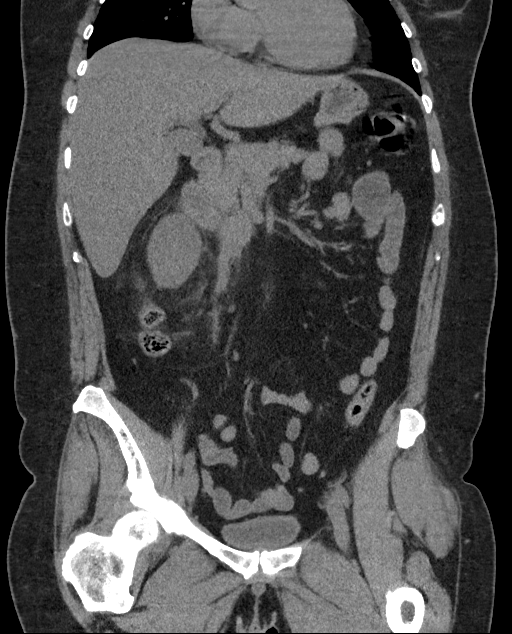
[im 61/138  bone]
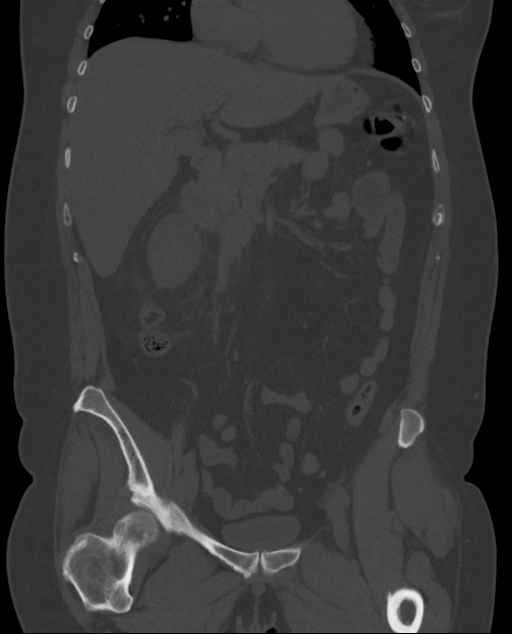
[im 77/138  soft-tissue]
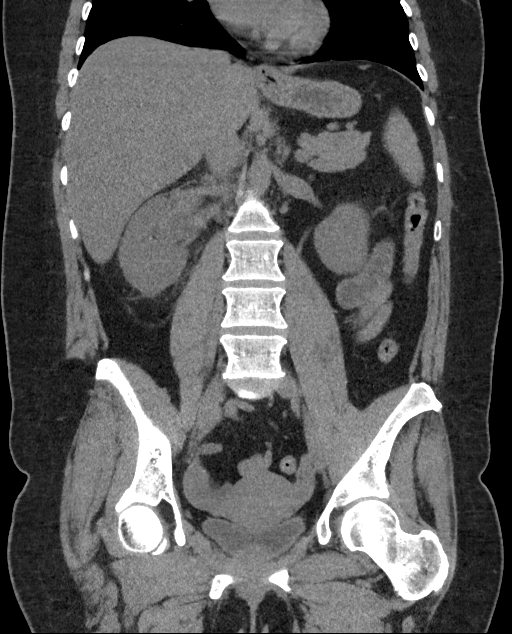

[Series 6: sagittal · sagittal · 0.53mm/px · 1 of 170 slices shown, 2 images]
[im 57/170  soft-tissue]
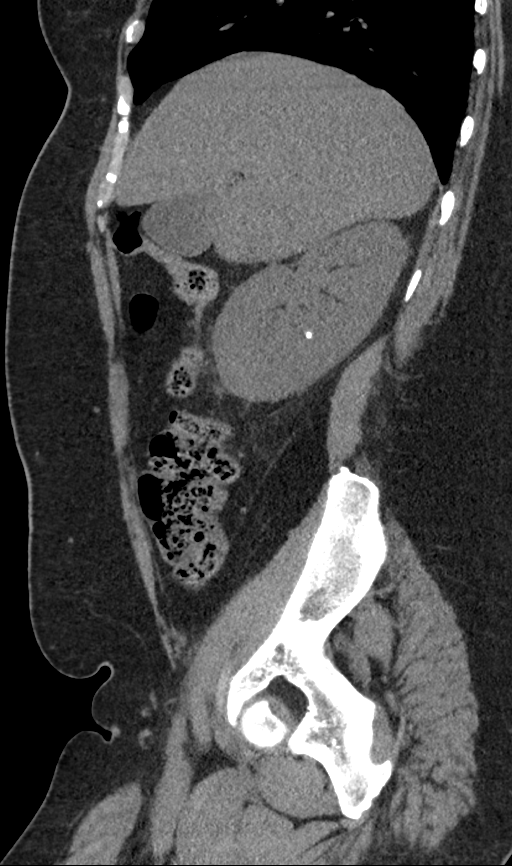
[im 57/170  bone]
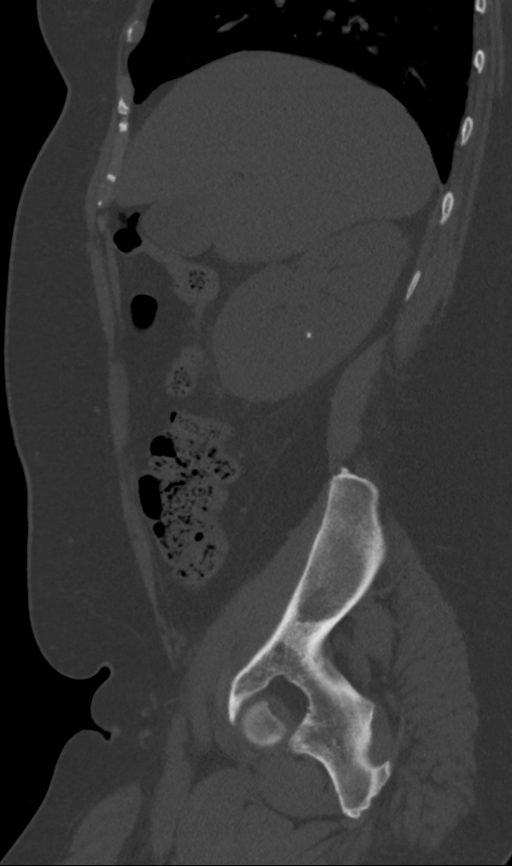

[10 of 46 positions shown; findings below may reference images not displayed]

FINDINGS: Lower chest: Within normal.

Hepatobiliary: Normal.

Pancreas: Normal.

Spleen: Normal.

Adrenals/Urinary Tract: Adrenal glands are normal. Kidneys normal in
size. No left renal stones. There are 2 small right renal stones
over the mid to lower pole of the collecting system. There is a
cm stone over the right renal pelvis near the UPJ causing low-grade
obstruction. The left ureter and remainder of the right ureter are
within normal.

Stomach/Bowel: Stomach and small bowel are within normal. Appendix
is normal. Colon is normal. Focal narrowing of the distal transverse
colon likely due to spasm/peristalsis.

Vascular/Lymphatic: Within normal.

Reproductive: Within normal.

Other: No significant free fluid.

Musculoskeletal: Within normal.
IMPRESSION: Right-sided nephrolithiasis. 1.3 cm stone over the right renal
pelvis near the UPJ causing low-grade obstruction.

## 2018-02-14 ENCOUNTER — Encounter: Payer: Self-pay | Admitting: Emergency Medicine

## 2018-02-14 ENCOUNTER — Encounter: Payer: Self-pay | Admitting: Physician Assistant

## 2018-02-14 DIAGNOSIS — Z0289 Encounter for other administrative examinations: Secondary | ICD-10-CM

## 2018-05-10 ENCOUNTER — Ambulatory Visit: Payer: BLUE CROSS/BLUE SHIELD | Admitting: Urology

## 2018-05-10 ENCOUNTER — Encounter: Payer: Self-pay | Admitting: Urology

## 2018-10-16 ENCOUNTER — Telehealth: Payer: Self-pay

## 2018-10-16 NOTE — Telephone Encounter (Signed)
Ok with me 

## 2018-10-16 NOTE — Telephone Encounter (Signed)
Either of [place] is fine please call to schedule   TMS

## 2018-10-16 NOTE — Telephone Encounter (Signed)
Copied from CRM [id number]. Topic: Appointment Scheduling - Scheduling Inquiry for Clinic >> Oct 16, 2018 10:42 AM Windy Kalata, NT wrote: Reason for CRM: patient is wanting to transfer from [facility] to [facility] station with Leotis Shames or Dr. Shirlee Latch due to the office being closer to her house. Please contact to reschedule once approved.

## 2018-10-17 NOTE — Telephone Encounter (Signed)
Either is fine with me too

## 2018-10-17 NOTE — Telephone Encounter (Signed)
Pt has been scheduled. Thank you.

## 2018-11-16 ENCOUNTER — Encounter: Payer: Self-pay | Admitting: Internal Medicine

## 2018-11-16 ENCOUNTER — Ambulatory Visit (INDEPENDENT_AMBULATORY_CARE_PROVIDER_SITE_OTHER): Payer: Self-pay | Admitting: Internal Medicine

## 2018-11-16 VITALS — BP 114/66 | HR 77 | Temp 98.0°F | Ht 63.0 in | Wt 178.8 lb

## 2018-11-16 DIAGNOSIS — E669 Obesity, unspecified: Secondary | ICD-10-CM

## 2018-11-16 DIAGNOSIS — Z1389 Encounter for screening for other disorder: Secondary | ICD-10-CM

## 2018-11-16 DIAGNOSIS — Z13220 Encounter for screening for lipoid disorders: Secondary | ICD-10-CM

## 2018-11-16 DIAGNOSIS — Z0000 Encounter for general adult medical examination without abnormal findings: Secondary | ICD-10-CM

## 2018-11-16 DIAGNOSIS — E559 Vitamin D deficiency, unspecified: Secondary | ICD-10-CM

## 2018-11-16 DIAGNOSIS — F319 Bipolar disorder, unspecified: Secondary | ICD-10-CM

## 2018-11-16 DIAGNOSIS — F32A Depression, unspecified: Secondary | ICD-10-CM

## 2018-11-16 DIAGNOSIS — R739 Hyperglycemia, unspecified: Secondary | ICD-10-CM

## 2018-11-16 DIAGNOSIS — Z1159 Encounter for screening for other viral diseases: Secondary | ICD-10-CM

## 2018-11-16 DIAGNOSIS — F419 Anxiety disorder, unspecified: Secondary | ICD-10-CM

## 2018-11-16 DIAGNOSIS — Z1329 Encounter for screening for other suspected endocrine disorder: Secondary | ICD-10-CM

## 2018-11-16 DIAGNOSIS — F329 Major depressive disorder, single episode, unspecified: Secondary | ICD-10-CM

## 2018-11-16 NOTE — Patient Instructions (Signed)

## 2018-11-16 NOTE — Progress Notes (Signed)
Chief Complaint  Patient presents with  . Transitions Of Care   TOC  1. C/w bipolar depression was on celexa and valproic acid in the past which helped but abilify did not has not seen psych in a while prev. [facility]. Mood is down no SI/HI working 60-70 hrs per week and house is a mess GAD 7 score 7 and PHQ 9 score 12 today  2. No other complaints today    Review of Systems  Constitutional: Negative for weight loss.  HENT: Negative for hearing loss.   Eyes: Negative for blurred vision.  Respiratory: Negative for shortness of breath.   Cardiovascular: Negative for chest pain.  Gastrointestinal: Negative for abdominal pain.  Musculoskeletal: Negative for falls.  Skin: Negative for rash.  Neurological: Negative for headaches.  Psychiatric/Behavioral: Positive for depression. Negative for suicidal ideas.   Past Medical History:  Diagnosis Date  . Bipolar disorder (HCC)   . Depression   . GERD (gastroesophageal reflux disease)   . Hair loss   . History of kidney stones   . Irregular menstrual cycle   . Migraines   . Obesity   . Palpitation    Past Surgical History:  Procedure Laterality Date  . CYSTOSCOPY W/ URETERAL STENT PLACEMENT Right 04/05/2017   Procedure: CYSTOSCOPY WITH STENT REPLACEMENT;  Surgeon: Vanna Scotland, MD;  Location: [facility];  Service: Urology;  Laterality: Right;  . CYSTOSCOPY WITH STENT PLACEMENT Right 03/21/2017   Procedure: CYSTOSCOPY WITH STENT PLACEMENT;  Surgeon: Bjorn Pippin, MD;  Location: [facility];  Service: Urology;  Laterality: Right;  . DILATION AND CURETTAGE OF UTERUS  12/27/2013  . TUBAL LIGATION    . URETEROSCOPY WITH HOLMIUM LASER LITHOTRIPSY Right 04/05/2017   Procedure: URETEROSCOPY WITH HOLMIUM LASER LITHOTRIPSY;  Surgeon: Vanna Scotland, MD;  Location: [facility] OR[facility];  Service: Urology;  Laterality: Right;   Family History  Problem Relation Age of Onset  . Metabolic syndrome Mother   . Cancer Mother        Ovarian and Cervical   . COPD Father    Social History   Socioeconomic History  . Marital status: Married    Spouse name: Not on file  . Number of children: Not on file  . Years of education: Not on file  . Highest education level: Not on file  Occupational History  . Not on file  Social Needs  . Financial resource strain: Not on file  . Food insecurity:    Worry: Not on file    Inability: Not on file  . Transportation needs:    Medical: Not on file    Non-medical: Not on file  Tobacco Use  . Smoking status: Current Every Day Smoker    Packs/day: 0.25    Years: 2.00    Pack years: 0.50    Types: Cigarettes  . Smokeless tobacco: Never Used  Substance and Sexual Activity  . Alcohol use: No    Alcohol/week: 0.0 standard drinks    Comment: rare  . Drug use: No  . Sexual activity: Yes    Partners: Female  Lifestyle  . Physical activity:    Days per week: Not on file    Minutes per session: Not on file  . Stress: Not on file  Relationships  . Social connections:    Talks on phone: Not on file    Gets together: Not on file    Attends religious service: Not on file    Active member of club or organization: Not on  file    Attends meetings of clubs or organizations: Not on file    Relationship status: Not on file  . Intimate partner violence:    Fear of current or ex partner: Not on file    Emotionally abused: Not on file    Physically abused: Not on file    Forced sexual activity: Not on file  Other Topics Concern  . Not on file  Social History Narrative   Single    2 kids girl age 36 boy age 428 as of 11/16/2018    Works in [employer]    No outpatient medications have been marked as taking for the 11/16/18 encounter (Office Visit) with McLean-Scocuzza, Pasty Spillersracy N, MD.   Allergies  Allergen Reactions  . Adhesive [Tape] Rash   No results found for this or any previous visit (from the past 2160 hour(s)). Objective  Body mass index is 31.67 kg/m. Wt Readings from Last 3 Encounters:   11/16/18 178 lb 12.8 oz (81.1 kg)  08/16/17 173 lb (78.5 kg)  05/17/17 172 lb (78 kg)   Temp Readings from Last 3 Encounters:  11/16/18 98 F (36.7 C) (Oral)  08/16/17 97.8 F (36.6 C) (Oral)  05/17/17 98.5 F (36.9 C) (Oral)   BP Readings from Last 3 Encounters:  11/16/18 114/66  08/16/17 100/70  05/17/17 100/62   Pulse Readings from Last 3 Encounters:  11/16/18 77  08/16/17 94  05/17/17 89    Physical Exam Vitals signs and nursing note reviewed.  Constitutional:      Appearance: Normal appearance. She is well-developed and well-groomed. She is obese.  HENT:     Head: Normocephalic and atraumatic.     Nose: Nose normal.     Mouth/Throat:     Mouth: Mucous membranes are moist.     Pharynx: Oropharynx is clear.  Eyes:     Conjunctiva/sclera: Conjunctivae normal.     Pupils: Pupils are equal, round, and reactive to light.  Cardiovascular:     Rate and Rhythm: Normal rate and regular rhythm.     Heart sounds: Normal heart sounds.  Pulmonary:     Effort: Pulmonary effort is normal.     Breath sounds: Normal breath sounds.  Skin:    General: Skin is warm and dry.     Comments: Multiple tattoos    Neurological:     General: No focal deficit present.     Mental Status: She is alert and oriented to person, place, and time. Mental status is at baseline.     Gait: Gait normal.  Psychiatric:        Attention and Perception: Attention and perception normal.        Mood and Affect: Mood and affect normal.        Speech: Speech normal.        Behavior: Behavior normal. Behavior is cooperative.        Thought Content: Thought content normal.        Cognition and Memory: Cognition and memory normal.        Judgment: Judgment normal.     Assessment   1. Bipolar depression uncontrolled GAD 7 7 today and PHQ 9 score 12  2. HM Plan   1.  Refer to psych for med management  abilify did not help in the past but celexa and valproic acid did  2.  Flu shot needs to get  upcoming 11/21/2018  Tdap utd  Check fasting labs upcoming  Pap in future at f/u  rec smoking cessation smoking 4 cig per day    Provider: Dr. French Ana McLean-Scocuzza-Internal Medicine

## 2018-11-16 NOTE — Progress Notes (Signed)
Pre visit review using our clinic review tool, if applicable. No additional management support is needed unless otherwise documented below in the visit note. 

## 2018-11-21 ENCOUNTER — Other Ambulatory Visit (INDEPENDENT_AMBULATORY_CARE_PROVIDER_SITE_OTHER): Payer: Self-pay

## 2018-11-21 DIAGNOSIS — E559 Vitamin D deficiency, unspecified: Secondary | ICD-10-CM

## 2018-11-21 DIAGNOSIS — Z0000 Encounter for general adult medical examination without abnormal findings: Secondary | ICD-10-CM

## 2018-11-21 DIAGNOSIS — Z13220 Encounter for screening for lipoid disorders: Secondary | ICD-10-CM

## 2018-11-21 DIAGNOSIS — Z1159 Encounter for screening for other viral diseases: Secondary | ICD-10-CM

## 2018-11-21 DIAGNOSIS — Z1329 Encounter for screening for other suspected endocrine disorder: Secondary | ICD-10-CM

## 2018-11-21 DIAGNOSIS — R739 Hyperglycemia, unspecified: Secondary | ICD-10-CM

## 2018-11-21 DIAGNOSIS — Z1389 Encounter for screening for other disorder: Secondary | ICD-10-CM

## 2018-11-21 LAB — CBC WITH DIFFERENTIAL/PLATELET
Basophils Absolute: 0 10*3/uL (ref 0.0–0.1)
Basophils Relative: 0.5 % (ref 0.0–3.0)
EOS ABS: 0.2 10*3/uL (ref 0.0–0.7)
Eosinophils Relative: 2.8 % (ref 0.0–5.0)
HCT: 39 % (ref 36.0–46.0)
Hemoglobin: 13.1 g/dL (ref 12.0–15.0)
Lymphocytes Relative: 21.9 % (ref 12.0–46.0)
Lymphs Abs: 1.9 10*3/uL (ref 0.7–4.0)
MCHC: 33.6 g/dL (ref 30.0–36.0)
MCV: 88.2 fl (ref 78.0–100.0)
Monocytes Absolute: 0.5 10*3/uL (ref 0.1–1.0)
Monocytes Relative: 6.4 % (ref 3.0–12.0)
Neutro Abs: 5.9 10*3/uL (ref 1.4–7.7)
Neutrophils Relative %: 68.4 % (ref 43.0–77.0)
Platelets: 285 10*3/uL (ref 150.0–400.0)
RBC: 4.42 Mil/uL (ref 3.87–5.11)
RDW: 13.4 % (ref 11.5–15.5)
WBC: 8.6 10*3/uL (ref 4.0–10.5)

## 2018-11-21 LAB — COMPREHENSIVE METABOLIC PANEL
ALBUMIN: 4.2 g/dL (ref 3.5–5.2)
ALT: 18 U/L (ref 0–35)
AST: 17 U/L (ref 0–37)
Alkaline Phosphatase: 42 U/L (ref 39–117)
BILIRUBIN TOTAL: 0.4 mg/dL (ref 0.2–1.2)
BUN: 15 mg/dL (ref 6–23)
CALCIUM: 8.8 mg/dL (ref 8.4–10.5)
CHLORIDE: 106 meq/L (ref 96–112)
CO2: 27 mEq/L (ref 19–32)
CREATININE: 0.58 mg/dL (ref 0.40–1.20)
GFR: 117.58 mL/min (ref >60.00)
Glucose, Bld: 83 mg/dL (ref 70–99)
Potassium: 3.8 mEq/L (ref 3.5–5.1)
Sodium: 139 mEq/L (ref 135–145)
Total Protein: 6.9 g/dL (ref 6.0–8.3)

## 2018-11-21 LAB — LIPID PANEL
CHOL/HDL RATIO: 4
Cholesterol: 162 mg/dL (ref 0–200)
HDL: 37.4 mg/dL — ABNORMAL LOW (ref >39.00)
LDL Cholesterol: 110 mg/dL — ABNORMAL HIGH (ref 0–99)
NONHDL: 124.39
Triglycerides: 73 mg/dL (ref 0.0–149.0)
VLDL: 14.6 mg/dL (ref 0.0–40.0)

## 2018-11-21 LAB — HEMOGLOBIN A1C: Hgb A1c MFr Bld: 5.3 % (ref 4.6–6.5)

## 2018-11-21 LAB — VITAMIN D 25 HYDROXY (VIT D DEFICIENCY, FRACTURES): VITD: 13.59 ng/mL — ABNORMAL LOW (ref 30.00–100.00)

## 2018-11-21 LAB — T4, FREE: FREE T4: 0.87 ng/dL (ref 0.60–1.60)

## 2018-11-21 LAB — TSH: TSH: 0.88 u[IU]/mL (ref 0.35–4.50)

## 2018-11-21 NOTE — Addendum Note (Signed)
Addended by: Penne Lash on: 11/21/2018 07:58 AM   Modules accepted: Orders

## 2018-11-22 ENCOUNTER — Other Ambulatory Visit: Payer: Self-pay | Admitting: Internal Medicine

## 2018-11-22 DIAGNOSIS — E559 Vitamin D deficiency, unspecified: Secondary | ICD-10-CM | POA: Insufficient documentation

## 2018-11-22 LAB — MEASLES/MUMPS/RUBELLA IMMUNITY: Rubeola IgG: <13.5 AU/mL — ABNORMAL LOW

## 2018-11-22 LAB — URINALYSIS, ROUTINE W REFLEX MICROSCOPIC
Bilirubin, UA: NEGATIVE
Glucose, UA: NEGATIVE
Ketones, UA: NEGATIVE
LEUKOCYTES UA: NEGATIVE
Nitrite, UA: NEGATIVE
PH UA: 6.5 (ref 5.0–7.5)
Protein, UA: NEGATIVE
RBC UA: NEGATIVE
SPEC GRAV UA: 1.02 (ref 1.005–1.030)
Urobilinogen, Ur: 0.2 mg/dL (ref 0.2–1.0)

## 2018-11-22 LAB — HEPATITIS B SURFACE ANTIBODY, QUANTITATIVE: Hepatitis B-Post: <5 m[IU]/mL — ABNORMAL LOW (ref >=10)

## 2018-11-22 MED ORDER — CHOLECALCIFEROL 1.25 MG (50000 UT) PO CAPS: 50000.0000 [IU] | ORAL_CAPSULE | ORAL | 1 refills | Status: DC

## 2018-11-29 ENCOUNTER — Encounter: Payer: Self-pay | Admitting: *Deleted

## 2018-12-05 ENCOUNTER — Ambulatory Visit (INDEPENDENT_AMBULATORY_CARE_PROVIDER_SITE_OTHER): Payer: Self-pay | Admitting: *Deleted

## 2018-12-05 DIAGNOSIS — Z23 Encounter for immunization: Secondary | ICD-10-CM

## 2018-12-29 ENCOUNTER — Ambulatory Visit: Payer: Self-pay | Admitting: Internal Medicine

## 2018-12-29 ENCOUNTER — Other Ambulatory Visit: Payer: Self-pay

## 2018-12-29 ENCOUNTER — Encounter: Payer: Self-pay | Admitting: Internal Medicine

## 2018-12-29 ENCOUNTER — Other Ambulatory Visit (HOSPITAL_COMMUNITY)
Admission: RE | Admit: 2018-12-29 | Discharge: 2018-12-29 | Disposition: A | Discharge status: Home / Self Care | Payer: Self-pay | Source: Ambulatory Visit | Attending: Internal Medicine | Admitting: Internal Medicine

## 2018-12-29 VITALS — BP 105/60 | HR 97 | Temp 98.9°F | Ht 63.0 in | Wt 179.6 lb

## 2018-12-29 DIAGNOSIS — Z124 Encounter for screening for malignant neoplasm of cervix: Secondary | ICD-10-CM

## 2018-12-29 DIAGNOSIS — Z0000 Encounter for general adult medical examination without abnormal findings: Secondary | ICD-10-CM | POA: Insufficient documentation

## 2018-12-29 DIAGNOSIS — F319 Bipolar disorder, unspecified: Secondary | ICD-10-CM

## 2018-12-29 DIAGNOSIS — E559 Vitamin D deficiency, unspecified: Secondary | ICD-10-CM

## 2018-12-29 NOTE — Patient Instructions (Signed)
Follow up in 6-12 months sooner if needed   Vitamin D Deficiency Vitamin D deficiency is when your body does not have enough vitamin D. Vitamin D is important to your body for many reasons:  It helps the body to absorb two important minerals, called calcium and phosphorus.  It plays a role in bone health.  It may help to prevent some diseases, such as diabetes and multiple sclerosis.  It plays a role in muscle function, including heart function. You can get vitamin D by:  Eating foods that naturally contain vitamin D.  Eating or drinking milk or other dairy products that have vitamin D added to them.  Taking a vitamin D supplement or a multivitamin supplement that contains vitamin D.  Being in the sun. Your body naturally makes vitamin D when your skin is exposed to sunlight. Your body changes the sunlight into a form of the vitamin that the body can use. If vitamin D deficiency is severe, it can cause a condition in which your bones become soft. In adults, this condition is called osteomalacia. In children, this condition is called rickets. What are the causes? Vitamin D deficiency may be caused by:  Not eating enough foods that contain vitamin D.  Not getting enough sun exposure.  Having certain digestive system diseases that make it difficult for your body to absorb vitamin D. These diseases include Crohn disease, chronic pancreatitis, and cystic fibrosis.  Having a surgery in which a part of the stomach or a part of the small intestine is removed.  Being obese.  Having chronic kidney disease or liver disease. What increases the risk? This condition is more likely to develop in:  Older people.  People who do not spend much time outdoors.  People who live in a long-term care facility.  People who have had broken bones.  People with weak or thin bones (osteoporosis).  People who have a disease or condition that changes how the body absorbs vitamin D.  People who  have dark skin.  People who take certain medicines, such as steroid medicines or certain seizure medicines.  People who are overweight or obese. What are the signs or symptoms? In mild cases of vitamin D deficiency, there may not be any symptoms. If the condition is severe, symptoms may include:  Bone pain.  Muscle pain.  Falling often.  Broken bones caused by a minor injury. How is this diagnosed? This condition is usually diagnosed with a blood test. How is this treated? Treatment for this condition may depend on what caused the condition. Treatment options include:  Taking vitamin D supplements.  Taking a calcium supplement. Your health care provider will suggest what dose is best for you. Follow these instructions at home:  Take medicines and supplements only as told by your health care provider.  Eat foods that contain vitamin D. Choices include: ? Fortified dairy products, cereals, or juices. Fortified means that vitamin D has been added to the food. Check the label on the package to be sure. ? Fatty fish, such as salmon or trout. ? Eggs. ? Oysters.  Do not use a tanning bed.  Maintain a healthy weight. Lose weight, if needed.  Keep all follow-up visits as told by your health care provider. This is important. Contact a health care provider if:  Your symptoms do not go away.  You feel like throwing up (nausea) or you throw up (vomit).  You have fewer bowel movements than usual or it is difficult for  you to have a bowel movement (constipation). This information is not intended to replace advice given to you by your health care provider. Make sure you discuss any questions you have with your health care provider. Document Released: 12/20/2011 Document Revised: 03/10/2016 Document Reviewed: 02/12/2015 Elsevier Interactive Patient Education  2019 [employer].

## 2018-12-29 NOTE — Progress Notes (Signed)
Chief Complaint  Patient presents with  . Follow-up    2- 4 mo   Annual  1. Vitamin D def on D3 50K weekly  2. Psych appt sch 03/13/2019   No new complaints    Review of Systems  Constitutional: Negative for weight loss.  HENT: Negative for hearing loss.   Eyes: Negative for blurred vision.  Respiratory: Negative for shortness of breath.   Cardiovascular: Negative for chest pain.  Gastrointestinal: Negative for abdominal pain.  Genitourinary:       Cycle started today    Skin: Negative for rash.  Neurological: Negative for headaches.  Psychiatric/Behavioral: Negative for depression.   Past Medical History:  Diagnosis Date  . Bipolar disorder ([place])   . Depression   . GERD (gastroesophageal reflux disease)   . Hair loss   . History of kidney stones   . Irregular menstrual cycle   . Migraines   . Obesity   . Palpitation    Past Surgical History:  Procedure Laterality Date  . CYSTOSCOPY W/ URETERAL STENT PLACEMENT Right 04/05/2017   Procedure: CYSTOSCOPY WITH STENT REPLACEMENT;  Surgeon: Hollice Espy, MD;  Location: [facility];  Service: Urology;  Laterality: Right;  . CYSTOSCOPY WITH STENT PLACEMENT Right 03/21/2017   Procedure: CYSTOSCOPY WITH STENT PLACEMENT;  Surgeon: Irine Seal, MD;  Location: [facility];  Service: Urology;  Laterality: Right;  . DILATION AND CURETTAGE OF UTERUS  12/27/2013  . TUBAL LIGATION    . URETEROSCOPY WITH HOLMIUM LASER LITHOTRIPSY Right 04/05/2017   Procedure: URETEROSCOPY WITH HOLMIUM LASER LITHOTRIPSY;  Surgeon: Hollice Espy, MD;  Location: [facility];  Service: Urology;  Laterality: Right;   Family History  Problem Relation Age of Onset  . Metabolic syndrome Mother   . Cancer Mother        Ovarian and Cervical  . COPD Father   . Cancer Other        breast   Social History   Socioeconomic History  . Marital status: Married    Spouse name: Not on file  . Number of children: Not on file  . Years of education: Not on file  .  Highest education level: Not on file  Occupational History  . Not on file  Social Needs  . Financial resource strain: Not on file  . Food insecurity:    Worry: Not on file    Inability: Not on file  . Transportation needs:    Medical: Not on file    Non-medical: Not on file  Tobacco Use  . Smoking status: Current Every Day Smoker    Packs/day: 0.25    Years: 2.00    Pack years: 0.50    Types: Cigarettes  . Smokeless tobacco: Never Used  Substance and Sexual Activity  . Alcohol use: No    Alcohol/week: 0.0 standard drinks    Comment: rare  . Drug use: No  . Sexual activity: Yes    Partners: Female  Lifestyle  . Physical activity:    Days per week: Not on file    Minutes per session: Not on file  . Stress: Not on file  Relationships  . Social connections:    Talks on phone: Not on file    Gets together: Not on file    Attends religious service: Not on file    Active member of club or organization: Not on file    Attends meetings of clubs or organizations: Not on file    Relationship status: Not on file  .  Intimate partner violence:    Fear of current or ex partner: Not on file    Emotionally abused: Not on file    Physically abused: Not on file    Forced sexual activity: Not on file  Other Topics Concern  . Not on file  Social History Narrative   Single    2 kids girl age 63 boy age 63 as of 11/16/2018    Works in [employer]    No outpatient medications have been marked as taking for the 12/29/18 encounter (Office Visit) with McLean-Scocuzza, Nino Glow, MD.   Allergies  Allergen Reactions  . Adhesive [Tape] Rash   Recent Results (from the past 2160 hour(s))  Urinalysis, Routine w reflex microscopic     Status: None   Collection Time: 11/21/18 10:03 AM  Result Value Ref Range   Specific Gravity, UA 1.020 1.005 - 1.030   pH, UA 6.5 5.0 - 7.5   Color, UA Yellow Yellow   Appearance Ur Clear Clear   Leukocytes, UA Negative Negative   Protein, UA Negative  Negative/Trace   Glucose, UA Negative Negative   Ketones, UA Negative Negative   RBC, UA Negative Negative   Bilirubin, UA Negative Negative   Urobilinogen, Ur 0.2 0.2 - 1.0 mg/dL   Nitrite, UA Negative Negative   Microscopic Examination Comment     Comment: Microscopic not indicated and not performed.  Hemoglobin A1c     Status: None   Collection Time: 11/21/18 10:03 AM  Result Value Ref Range   Hgb A1c MFr Bld 5.3 4.6 - 6.5 %    Comment: Glycemic Control Guidelines for People with Diabetes:Non Diabetic:  <6%Goal of Therapy: <7%Additional Action Suggested:  >8%   Vitamin D (25 hydroxy)     Status: Abnormal   Collection Time: 11/21/18 10:03 AM  Result Value Ref Range   VITD 13.59 (L) 30.00 - 100.00 ng/mL  Hepatitis B surface antibody,quantitative     Status: Abnormal   Collection Time: 11/21/18 10:03 AM  Result Value Ref Range   Hepatitis B-Post <5 (L) > OR = 10 mIU/mL    Comment: . Patient does not have immunity to hepatitis B virus. . For additional information, please refer to http://education.questdiagnostics.com/faq/FAQ105 (This link is being provided for informational/ educational purposes only).   Measles/Mumps/Rubella Immunity     Status: Abnormal   Collection Time: 11/21/18 10:03 AM  Result Value Ref Range   Rubeola IgG <13.50 (L) AU/mL    Comment: AU/mL            Interpretation -----            -------------- <13.50           Negative 13.50-16.49      Equivocal >16.49           Positive . A positive result indicates that the patient has antibody to measles virus. It does not differentiate  between an active or past infection. The clinical  diagnosis must be interpreted in conjunction with  clinical signs and symptoms of the patient.    Mumps IgG <9.00 (L) AU/mL    Comment:  AU/mL           Interpretation -------         ---------------- <9.00             Negative 9.00-10.99        Equivocal >10.99            Positive A positive result indicates that the  patient has  antibody to mumps virus. It does not differentiate between an  active or past infection. The clinical diagnosis must be interpreted in conjunction with clinical signs and symptoms of the patient. .    Rubella <0.90 (L) index    Comment:     Index            Interpretation     -----            --------------       <0.90            Not consistent with Immunity     0.90-0.99        Equivocal     > or = 1.00      Consistent with Immunity  . The presence of rubella IgG antibody suggests  immunization or past or current infection with rubella virus.   TSH     Status: None   Collection Time: 11/21/18 10:03 AM  Result Value Ref Range   TSH 0.88 0.35 - 4.50 uIU/mL  Lipid panel     Status: Abnormal   Collection Time: 11/21/18 10:03 AM  Result Value Ref Range   Cholesterol 162 0 - 200 mg/dL    Comment: ATP III Classification       Desirable:  < 200 mg/dL               Borderline High:  200 - 239 mg/dL          High:  > = 240 mg/dL   Triglycerides 73.0 0.0 - 149.0 mg/dL    Comment: Normal:  <150 mg/dLBorderline High:  150 - 199 mg/dL   HDL 37.40 (L) >39.00 mg/dL   VLDL 14.6 0.0 - 40.0 mg/dL   LDL Cholesterol 110 (H) 0 - 99 mg/dL   Total CHOL/HDL Ratio 4     Comment:                Men          Women1/2 Average Risk     3.4          3.3Average Risk          5.0          4.42X Average Risk          9.6          7.13X Average Risk          15.0          11.0                       NonHDL 124.39     Comment: NOTE:  Non-HDL goal should be 30 mg/dL higher than patient's LDL goal (i.e. LDL goal of < 70 mg/dL, would have non-HDL goal of < 100 mg/dL)  T4, free     Status: None   Collection Time: 11/21/18 10:03 AM  Result Value Ref Range   Free T4 0.87 0.60 - 1.60 ng/dL    Comment: Specimens from patients who are undergoing biotin therapy and /or ingesting biotin supplements may contain high levels of biotin.  The higher biotin concentration in these specimens interferes with this Free  T4 assay.  Specimens that contain high levels  of biotin may cause false high results for this Free T4 assay.  Please interpret results in light of the total clinical presentation of the patient.    CBC with Differential/Platelet     Status: None  Collection Time: 11/21/18 10:03 AM  Result Value Ref Range   WBC 8.6 4.0 - 10.5 K/uL   RBC 4.42 3.87 - 5.11 Mil/uL   Hemoglobin 13.1 12.0 - 15.0 g/dL   HCT 39.0 36.0 - 46.0 %   MCV 88.2 78.0 - 100.0 fl   MCHC 33.6 30.0 - 36.0 g/dL   RDW 13.4 11.5 - 15.5 %   Platelets 285.0 150.0 - 400.0 K/uL   Neutrophils Relative % 68.4 43.0 - 77.0 %   Lymphocytes Relative 21.9 12.0 - 46.0 %   Monocytes Relative 6.4 3.0 - 12.0 %   Eosinophils Relative 2.8 0.0 - 5.0 %   Basophils Relative 0.5 0.0 - 3.0 %   Neutro Abs 5.9 1.4 - 7.7 K/uL   Lymphs Abs 1.9 0.7 - 4.0 K/uL   Monocytes Absolute 0.5 0.1 - 1.0 K/uL   Eosinophils Absolute 0.2 0.0 - 0.7 K/uL   Basophils Absolute 0.0 0.0 - 0.1 K/uL  Comprehensive metabolic panel     Status: None   Collection Time: 11/21/18 10:03 AM  Result Value Ref Range   Sodium 139 135 - 145 mEq/L   Potassium 3.8 3.5 - 5.1 mEq/L   Chloride 106 96 - 112 mEq/L   CO2 27 19 - 32 mEq/L   Glucose, Bld 83 70 - 99 mg/dL   BUN 15 6 - 23 mg/dL   Creatinine, Ser 0.58 0.40 - 1.20 mg/dL   Total Bilirubin 0.4 0.2 - 1.2 mg/dL   Alkaline Phosphatase 42 39 - 117 U/L   AST 17 0 - 37 U/L   ALT 18 0 - 35 U/L   Total Protein 6.9 6.0 - 8.3 g/dL   Albumin 4.2 3.5 - 5.2 g/dL   Calcium 8.8 8.4 - 10.5 mg/dL   GFR 117.58 >60.00 mL/min   Objective  Body mass index is 31.81 kg/m. Wt Readings from Last 3 Encounters:  12/29/18 179 lb 9.6 oz (81.5 kg)  11/16/18 178 lb 12.8 oz (81.1 kg)  08/16/17 173 lb (78.5 kg)   Temp Readings from Last 3 Encounters:  12/29/18 98.9 F (37.2 C) (Oral)  11/16/18 98 F (36.7 C) (Oral)  08/16/17 97.8 F (36.6 C) (Oral)   BP Readings from Last 3 Encounters:  12/29/18 105/60  11/16/18 114/66  08/16/17  100/70   Pulse Readings from Last 3 Encounters:  12/29/18 97  11/16/18 77  08/16/17 94    Physical Exam Vitals signs and nursing note reviewed. Exam conducted with a chaperone present.  Constitutional:      Appearance: Normal appearance. She is well-developed and well-groomed.  HENT:     Head: Normocephalic and atraumatic.     Nose: Nose normal.     Mouth/Throat:     Mouth: Mucous membranes are moist.     Pharynx: Oropharynx is clear.  Eyes:     Conjunctiva/sclera: Conjunctivae normal.     Pupils: Pupils are equal, round, and reactive to light.  Cardiovascular:     Rate and Rhythm: Normal rate and regular rhythm.     Heart sounds: Normal heart sounds.  Pulmonary:     Effort: Pulmonary effort is normal.     Breath sounds: Normal breath sounds.  Chest:     Chest wall: No mass.     Breasts: Breasts are asymmetrical.        Right: No swelling, bleeding, inverted nipple, mass, nipple discharge, skin change or tenderness.        Left: No swelling, bleeding, inverted nipple, mass,  nipple discharge, skin change or tenderness.     Comments: Left breast > right breast  Chronic  Genitourinary:    Pubic Area: No rash.      Labia:        Right: No rash.        Left: No rash.      Vagina: Normal.     Cervix: Normal.     Uterus: Normal.      Adnexa: Right adnexa normal and left adnexa normal.    Lymphadenopathy:     Upper Body:     Right upper body: No axillary adenopathy.     Left upper body: No axillary adenopathy.  Skin:    General: Skin is warm and dry.  Neurological:     General: No focal deficit present.     Mental Status: She is alert and oriented to person, place, and time. Mental status is at baseline.     Gait: Gait normal.  Psychiatric:        Attention and Perception: Attention and perception normal.        Mood and Affect: Mood and affect normal.        Speech: Speech normal.        Behavior: Behavior normal. Behavior is cooperative.        Thought Content:  Thought content normal.        Cognition and Memory: Cognition and memory normal.        Judgment: Judgment normal.     Assessment   1. Annual  2. Vit D def  3. Bipolar depression Plan  1.  Flu shot needs to get upcoming 11/21/2018  Tdap utd  rec MMR and hep B vaccine given Rx mmr vaccine today Breast exam today    Pap today rec smoking cessation smoking 4 cig per day  2. rec D3 50K weekly x 6 months then 5000 IU daily  3. Psych appt 03/13/2019    Provider: Dr. Olivia Mackie McLean-Scocuzza-Internal Medicine

## 2019-01-02 LAB — CYTOLOGY - PAP
DIAGNOSIS: NEGATIVE
HPV: NOT DETECTED

## 2019-04-26 IMAGING — US US RENAL
1 series · 14 of 25 positions shown · non-contrast
Comparison: CT abdomen and pelvis 03/21/2017

CLINICAL DATA: RIGHT nephrolithiasis, follow-up post RIGHT
ureteroscopy

EXAM:
RENAL / URINARY TRACT ULTRASOUND COMPLETE

[Series 1: us renal · 0.23mm/px · 14 of 50 slices shown]
[im 1/50]
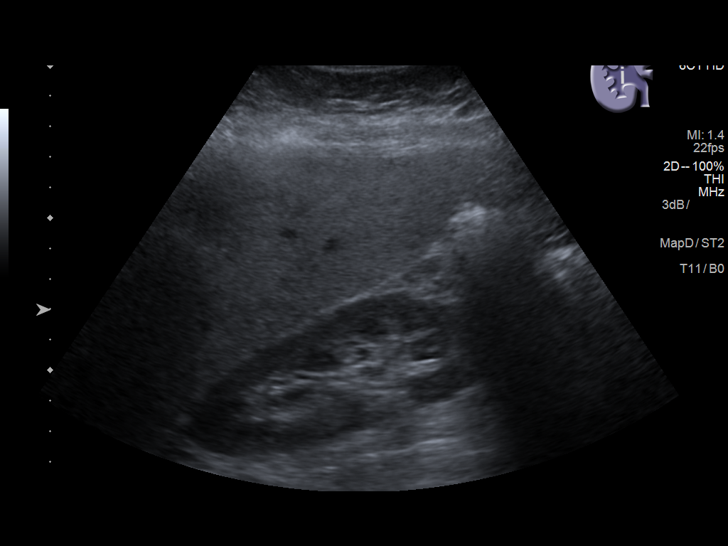
[im 5/50]
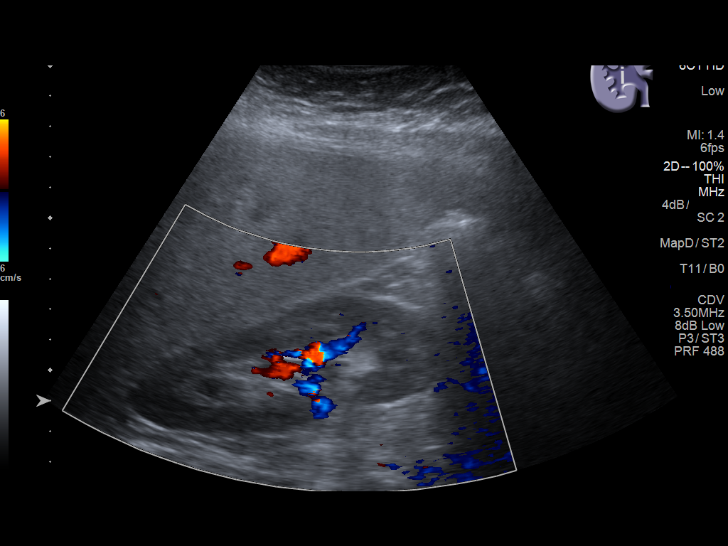
[im 9/50]
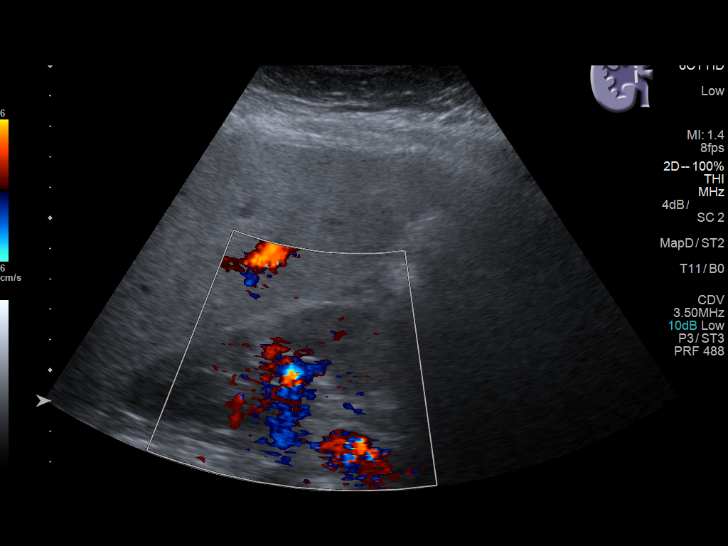
[im 13/50]
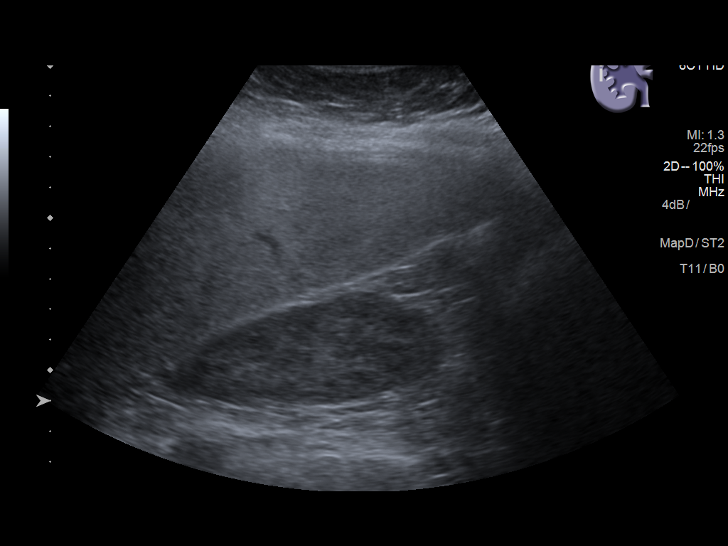
[im 17/50]
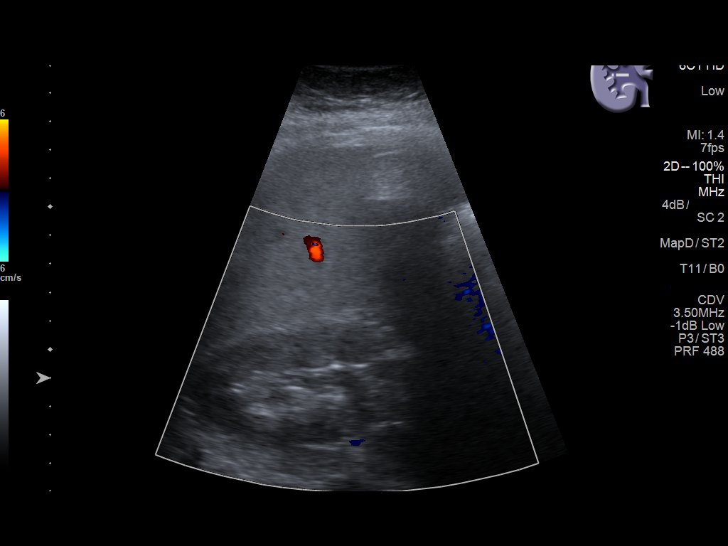
[im 19/50]
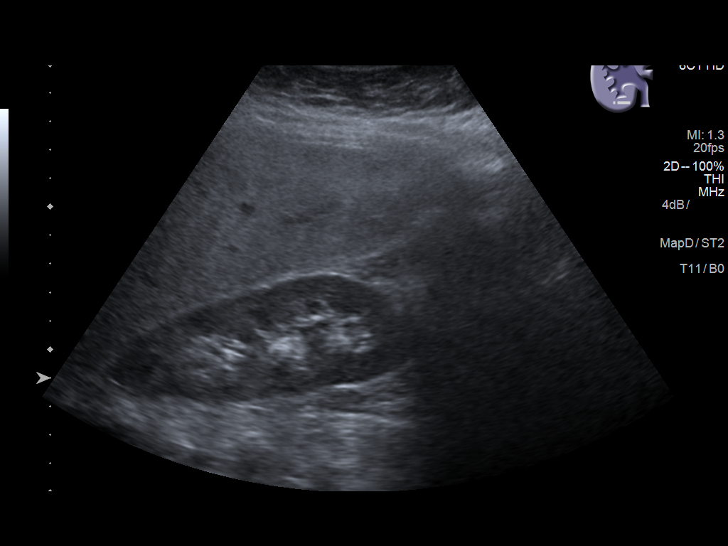
[im 23/50]
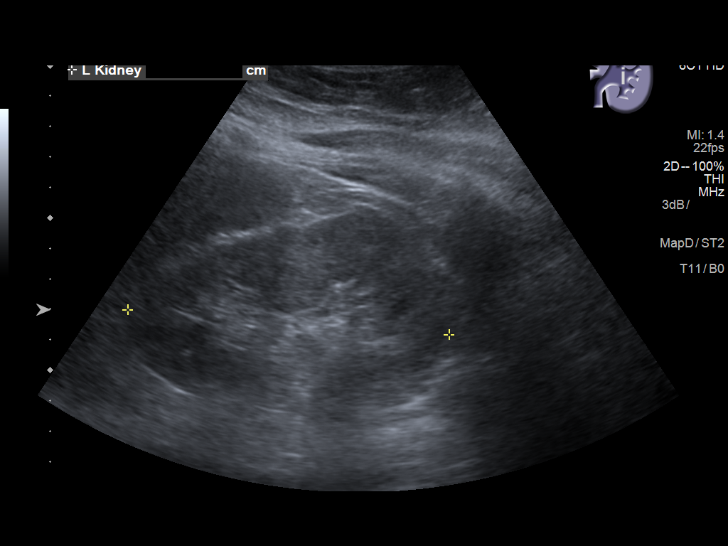
[im 27/50]
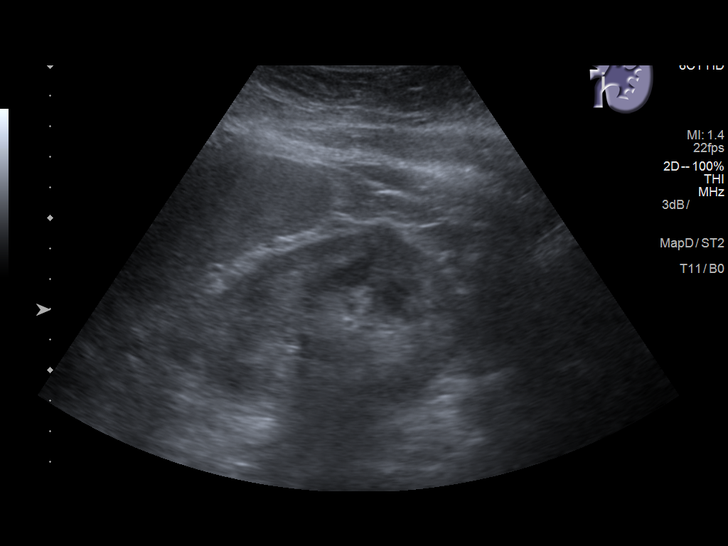
[im 31/50]
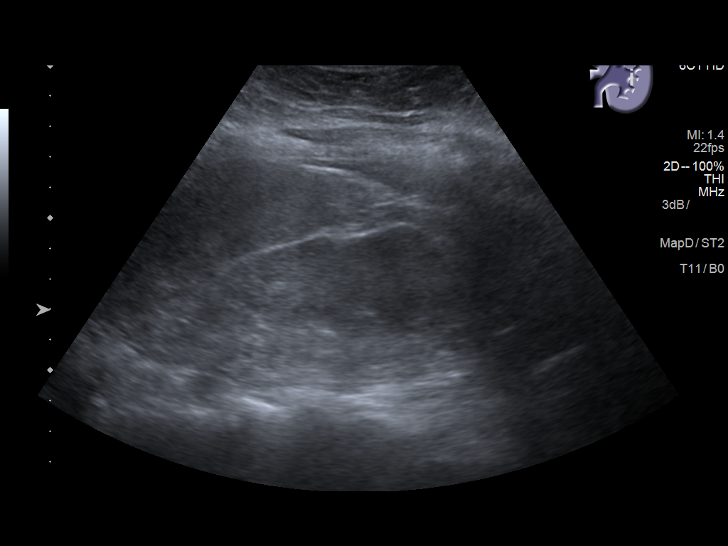
[im 33/50]
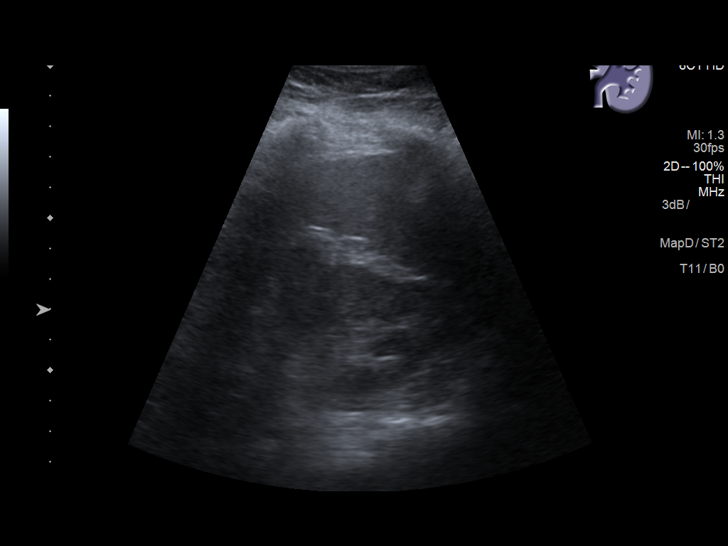
[im 37/50]
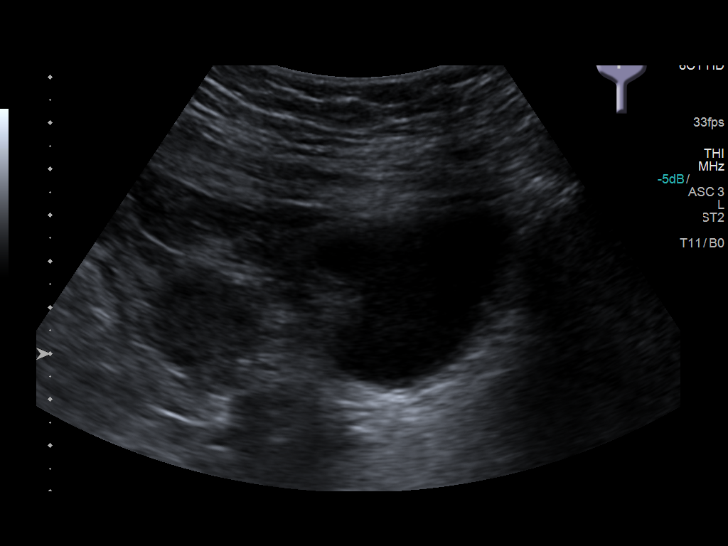
[im 41/50]
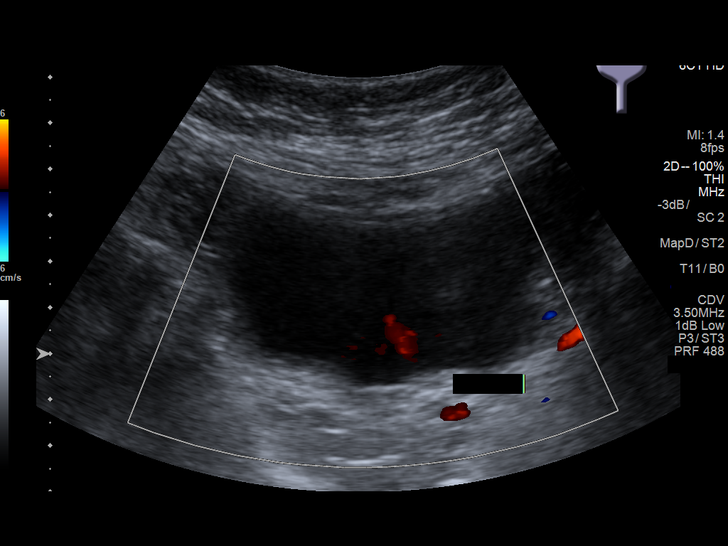
[im 45/50]
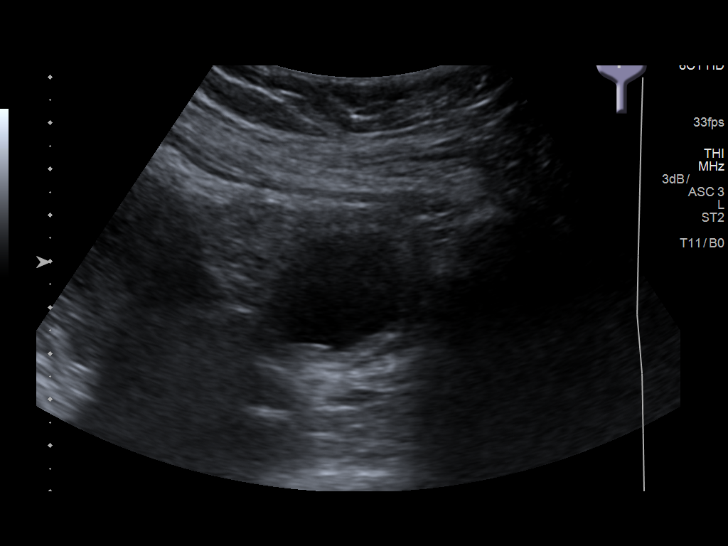
[im 50/50]
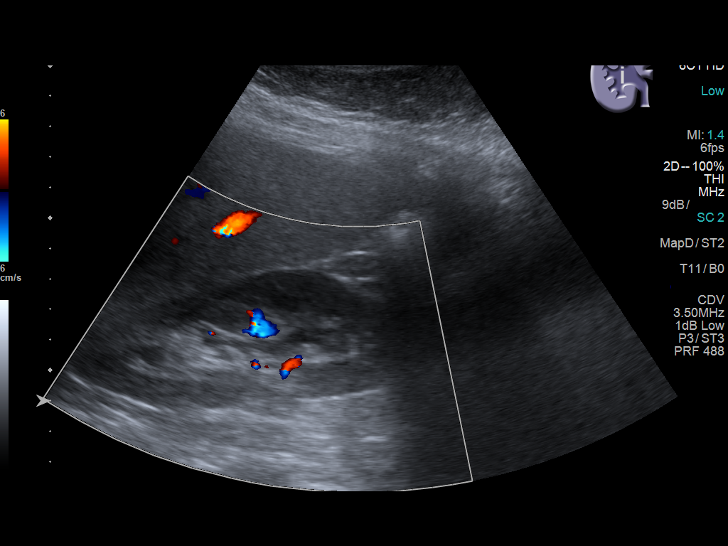

[14 of 25 positions shown; findings below may reference images not displayed]

FINDINGS: Right Kidney:

Length: 10.9 cm. Normal cortical thickness and echogenicity. Mild
hydronephrosis. No mass identified. 7 mm echogenic focus at mid
kidney without definite shadowing, questionable for nonobstructing
calculus.

Left Kidney:

Length: 10.6 cm.  Normal morphology without mass or hydronephrosis.

Bladder:

Moderately well distended without mass. BILATERAL ureteral jets
noted.

Incidentally noted: Echogenic hepatic parenchyma, likely fatty
infiltration though this can be seen with cirrhosis and certain
infiltrative disorders.
IMPRESSION: Mild LEFT hydronephrosis.

Questionable 7 mm nonobstructing RIGHT renal calculus.

BILATERAL ureteral jets noted at the urinary bladder.

Question fatty infiltration of liver as above.

## 2020-08-25 ENCOUNTER — Other Ambulatory Visit: Payer: Self-pay

## 2020-08-27 ENCOUNTER — Encounter: Payer: Self-pay | Admitting: Internal Medicine

## 2020-08-27 ENCOUNTER — Ambulatory Visit (INDEPENDENT_AMBULATORY_CARE_PROVIDER_SITE_OTHER): Payer: Self-pay | Admitting: Internal Medicine

## 2020-08-27 ENCOUNTER — Other Ambulatory Visit: Payer: Self-pay

## 2020-08-27 VITALS — BP 117/76 | HR 94 | Temp 98.2°F | Ht 63.74 in | Wt 183.4 lb

## 2020-08-27 DIAGNOSIS — E538 Deficiency of other specified B group vitamins: Secondary | ICD-10-CM

## 2020-08-27 DIAGNOSIS — Z23 Encounter for immunization: Secondary | ICD-10-CM

## 2020-08-27 DIAGNOSIS — Z0000 Encounter for general adult medical examination without abnormal findings: Secondary | ICD-10-CM

## 2020-08-27 DIAGNOSIS — F319 Bipolar disorder, unspecified: Secondary | ICD-10-CM

## 2020-08-27 DIAGNOSIS — Z1389 Encounter for screening for other disorder: Secondary | ICD-10-CM

## 2020-08-27 DIAGNOSIS — R739 Hyperglycemia, unspecified: Secondary | ICD-10-CM

## 2020-08-27 DIAGNOSIS — Z0184 Encounter for antibody response examination: Secondary | ICD-10-CM

## 2020-08-27 DIAGNOSIS — Z13818 Encounter for screening for other digestive system disorders: Secondary | ICD-10-CM

## 2020-08-27 DIAGNOSIS — Z1329 Encounter for screening for other suspected endocrine disorder: Secondary | ICD-10-CM

## 2020-08-27 DIAGNOSIS — F339 Major depressive disorder, recurrent, unspecified: Secondary | ICD-10-CM

## 2020-08-27 DIAGNOSIS — E559 Vitamin D deficiency, unspecified: Secondary | ICD-10-CM

## 2020-08-27 DIAGNOSIS — N939 Abnormal uterine and vaginal bleeding, unspecified: Secondary | ICD-10-CM

## 2020-08-27 DIAGNOSIS — F419 Anxiety disorder, unspecified: Secondary | ICD-10-CM

## 2020-08-27 DIAGNOSIS — E669 Obesity, unspecified: Secondary | ICD-10-CM

## 2020-08-27 NOTE — Patient Instructions (Addendum)
Refer to [facility] therapy and Dr. Nicolasa Ducking psychiatry  Consider MMR vaccine at the pharmacy and hepatitis B vaccine had at work (noted  Tdap vaccine consider here or pharmacy   Results for Kara [name], Kara [name] (MRN [id number]) as of 08/27/2020 11:33  Ref. Range 11/21/2018 10:03  Rubella Latest Units: index <0.90 (L)  Hepatitis B-Post Latest Ref Range: > OR = 10 mIU/mL <5 (L)  Mumps IgG Latest Units: AU/mL <9.00 (L)  Rubeola IgG Latest Units: AU/mL <13.50 (L)    Vitamin D Deficiency Vitamin D deficiency is when your body does not have enough vitamin D. Vitamin D is important to your body for many reasons:  It helps the body absorb two important minerals--calcium and phosphorus.  It plays a role in bone health.  It may help to prevent some diseases, such as diabetes and multiple sclerosis.  It plays a role in muscle function, including heart function. If vitamin D deficiency is severe, it can cause a condition in which your bones become soft. In adults, this condition is called osteomalacia. In children, this condition is called rickets. What are the causes? This condition may be caused by:  Not eating enough foods that contain vitamin D.  Not getting enough natural sun exposure.  Having certain digestive system diseases that make it difficult for your body to absorb vitamin D. These diseases include Crohn's disease, chronic pancreatitis, and cystic fibrosis.  Having a surgery in which a part of the stomach or a part of the small intestine is removed.  Having chronic kidney disease or liver disease. What increases the risk? You are more likely to develop this condition if you:  Are older.  Do not spend much time outdoors.  Live in a long-term care facility.  Have had broken bones.  Have weak or thin bones (osteoporosis).  Have a disease or condition that changes how the body absorbs vitamin D.  Have dark skin.  Take certain medicines, such as steroid medicines or  certain seizure medicines.  Are overweight or obese. What are the signs or symptoms? In mild cases of vitamin D deficiency, there may not be any symptoms. If the condition is severe, symptoms may include:  Bone pain.  Muscle pain.  Falling often.  Broken bones caused by a minor injury. How is this diagnosed? This condition may be diagnosed with blood tests. Imaging tests such as X-rays may also be done to look for changes in the bone. How is this treated? Treatment for this condition may depend on what caused the condition. Treatment options include:  Taking vitamin D supplements. Your health care provider will suggest what dose is best for you.  Taking a calcium supplement. Your health care provider will suggest what dose is best for you. Follow these instructions at home: Eating and drinking   Eat foods that contain vitamin D. Choices include: ? Fortified dairy products, cereals, or juices. Fortified means that vitamin D has been added to the food. Check the label on the package to see if the food is fortified. ? Fatty fish, such as salmon or trout. ? Eggs. ? Oysters. ? Mushrooms. The items listed above may not be a complete list of recommended foods and beverages. Contact a dietitian for more information. General instructions  Take medicines and supplements only as told by your health care provider.  Get regular, safe exposure to natural sunlight.  Do not use a tanning bed.  Maintain a healthy weight. Lose weight if needed.  Keep all follow-up visits  as told by your health care provider. This is important. How is this prevented? You can get vitamin D by:  Eating foods that naturally contain vitamin D.  Eating or drinking products that have been fortified with vitamin D, such as cereals, juices, and dairy products (including milk).  Taking a vitamin D supplement or a multivitamin supplement that contains vitamin D.  Being in the sun. Your body naturally makes  vitamin D when your skin is exposed to sunlight. Your body changes the sunlight into a form of the vitamin that it can use. Contact a health care provider if:  Your symptoms do not go away.  You feel nauseous or you vomit.  You have fewer bowel movements than usual or are constipated. Summary  Vitamin D deficiency is when your body does not have enough vitamin D.  Vitamin D is important to your body for good bone health and muscle function, and it may help prevent some diseases.  Vitamin D deficiency is primarily treated through supplementation. Your health care provider will suggest what dose is best for you.  You can get vitamin D by eating foods that contain vitamin D, by being in the sun, and by taking a vitamin D supplement or a multivitamin supplement that contains vitamin D. This information is not intended to replace advice given to you by your health care provider. Make sure you discuss any questions you have with your health care provider. Document Revised: 06/05/2018 Document Reviewed: 06/05/2018 Elsevier Patient Education  [place].

## 2020-08-27 NOTE — Progress Notes (Signed)
Chief Complaint  Patient presents with  . Annual Exam  . Immunizations   Annual  1. H/o bipolar depression and anxiety was on valproic acid and celexa which helped in the past now has [employer] and will refer again and to therapy Worse depression since 01/2020 dad dx'ed metastatic lung cancer and she had to move which is a trigger  2. Abnormal bleeding bled x 2x per month and having heavier and lighter flow    Review of Systems  Constitutional: Negative for weight loss.  HENT: Negative for hearing loss.   Eyes: Negative for blurred vision.  Respiratory: Negative for shortness of breath.   Cardiovascular: Negative for chest pain.  Genitourinary:       +abnormal cycles   Musculoskeletal: Negative for falls.  Skin: Negative for rash.  Neurological: Negative for headaches.  Psychiatric/Behavioral: Positive for depression. The patient is nervous/anxious.    Past Medical History:  Diagnosis Date  . Bipolar disorder ([place])   . Depression   . GERD (gastroesophageal reflux disease)   . Hair loss   . History of kidney stones   . Irregular menstrual cycle   . Migraines   . Obesity   . Palpitation    Past Surgical History:  Procedure Laterality Date  . CYSTOSCOPY W/ URETERAL STENT PLACEMENT Right 04/05/2017   Procedure: CYSTOSCOPY WITH STENT REPLACEMENT;  Surgeon: Hollice Espy, MD;  Location: [facility];  Service: Urology;  Laterality: Right;  . CYSTOSCOPY WITH STENT PLACEMENT Right 03/21/2017   Procedure: CYSTOSCOPY WITH STENT PLACEMENT;  Surgeon: Irine Seal, MD;  Location: [facility];  Service: Urology;  Laterality: Right;  . DILATION AND CURETTAGE OF UTERUS  12/27/2013  . TUBAL LIGATION    . URETEROSCOPY WITH HOLMIUM LASER LITHOTRIPSY Right 04/05/2017   Procedure: URETEROSCOPY WITH HOLMIUM LASER LITHOTRIPSY;  Surgeon: Hollice Espy, MD;  Location: [facility];  Service: Urology;  Laterality: Right;   Family History  Problem Relation Age of Onset  . Metabolic syndrome Mother    . Cancer Mother        Ovarian and Cervical  . COPD Father   . Cancer Father        lung, (primary) liver, bone +smoker dx 01/2020  . Autism Son        34 y.o  . ADD / ADHD Son   . Cancer Other        breast  . Cancer Maternal Great-grandmother        breast cancer   Social History   Socioeconomic History  . Marital status: Married    Spouse name: Not on file  . Number of children: Not on file  . Years of education: Not on file  . Highest education level: Not on file  Occupational History  . Not on file  Tobacco Use  . Smoking status: Current Every Day Smoker    Packs/day: 0.25    Years: 2.00    Pack years: 0.50    Types: Cigarettes  . Smokeless tobacco: Never Used  Substance and Sexual Activity  . Alcohol use: No    Alcohol/week: 0.0 standard drinks    Comment: rare  . Drug use: No  . Sexual activity: Yes    Partners: Female  Other Topics Concern  . 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    Social Determinants of Health  Financial Resource Strain:   . Difficulty of Paying Living Expenses: Not on file  Food Insecurity:   . Worried About Charity fundraiser in the Last Year: Not on file  . Ran Out of Food in the Last Year: Not on file  Transportation Needs:   . Lack of Transportation (Medical): Not on file  . Lack of Transportation (Non-Medical): Not on file  Physical Activity:   . Days of Exercise per Week: Not on file  . Minutes of Exercise per Session: Not on file  Stress:   . Feeling of Stress : Not on file  Social Connections:   . Frequency of Communication with Friends and Family: Not on file  . Frequency of Social Gatherings with Friends and Family: Not on file  . Attends Religious Services: Not on file  . Active Member of Clubs or Organizations: Not on file  . Attends Archivist Meetings: Not on file  . Marital Status: Not on file   Intimate Partner Violence:   . Fear of Current or Ex-Partner: Not on file  . Emotionally Abused: Not on file  . Physically Abused: Not on file  . Sexually Abused: Not on file   No outpatient medications have been marked as taking for the 08/27/20 encounter (Office Visit) with McLean-Scocuzza, Nino Glow, MD.   Allergies  Allergen Reactions  . Adhesive [Tape] Rash   No results found for this or any previous visit (from the past 2160 hour(s)). Objective  Body mass index is 31.74 kg/m. Wt Readings from Last 3 Encounters:  08/27/20 183 lb 6.4 oz (83.2 kg)  12/29/18 179 lb 9.6 oz (81.5 kg)  11/16/18 178 lb 12.8 oz (81.1 kg)   Temp Readings from Last 3 Encounters:  08/27/20 98.2 F (36.8 C) (Oral)  12/29/18 98.9 F (37.2 C) (Oral)  11/16/18 98 F (36.7 C) (Oral)   BP Readings from Last 3 Encounters:  08/27/20 117/76  12/29/18 105/60  11/16/18 114/66   Pulse Readings from Last 3 Encounters:  08/27/20 94  12/29/18 97  11/16/18 77    Physical Exam Vitals and nursing note reviewed.  Constitutional:      Appearance: Normal appearance. She is well-developed and well-groomed. She is obese.  HENT:     Head: Normocephalic and atraumatic.  Eyes:     Conjunctiva/sclera: Conjunctivae normal.     Pupils: Pupils are equal, round, and reactive to light.  Cardiovascular:     Rate and Rhythm: Normal rate and regular rhythm.     Heart sounds: Normal heart sounds. No murmur heard.   Pulmonary:     Effort: Pulmonary effort is normal.     Breath sounds: Normal breath sounds.  Chest:     Chest wall: No mass.     Breasts: Breasts are asymmetrical.        Right: Normal.      Comments: Left breast larger than right  Abdominal:     Tenderness: There is no abdominal tenderness.  Lymphadenopathy:     Upper Body:     Right upper body: No axillary adenopathy.     Left upper body: No axillary adenopathy.  Skin:    General: Skin is warm and dry.  Neurological:     General: No focal  deficit present.     Mental Status: She is alert and oriented to person, place, and time. Mental status is at baseline.     Gait: Gait normal.  Psychiatric:  Attention and Perception: Attention and perception normal.        Mood and Affect: Mood and affect normal.        Speech: Speech normal.        Behavior: Behavior normal. Behavior is cooperative.        Thought Content: Thought content normal.        Cognition and Memory: Cognition and memory normal.        Judgment: Judgment normal.     Assessment  Plan  Annual physical exam - Plan: fasting labs [facility] Needs flu shot - Plan: Flu Vaccine QUAD 6+ mos PF IM (Fluarix Quad PF) Flu shot given today  Tdap rec rec MMR  hep B vaccine had a work check titer  Breast exam today normal except left breast larger than right this is normal for her   Pap  12/29/18 neg neg HPV irregular cycles as of 08/27/20 light to heavy  -had 2 x in 1 month in 06/2020  D&C 01/2014 with polyps  rec smoking cessation smoking 4 cig per day rec healthy diet and exercise  Skin no need derm today  Consider mammo age 17  Colonoscopy age 62    Depression, recurrent ([place]) - Plan: Ambulatory referral to Psychiatry, Ambulatory referral to Psychology  Anxiety - Plan: Ambulatory referral to Psychiatry, Ambulatory referral to Psychology   Bipolar depression ([place]) - Plan: Ambulatory referral to Psychiatry, Ambulatory referral to Psychology  Abnormal uterine bleeding (AUB) - Plan: Ambulatory referral to Obstetrics / Gynecology  Provider: Dr. Olivia Mackie McLean-Scocuzza-Internal Medicine

## 2021-08-27 ENCOUNTER — Encounter: Payer: Self-pay | Admitting: Internal Medicine

## 2021-08-27 ENCOUNTER — Telehealth: Payer: Self-pay | Admitting: Internal Medicine

## 2021-08-27 NOTE — Telephone Encounter (Signed)
Patient no-showed today's appointment; appointment was for 08/27/21, provider notified for review of record. Letter sent for patient to call in and re-schedule.
